# Patient Record
Sex: Female | Born: 1980 | Race: White | Hispanic: No | Marital: Single | Smoking: Current every day smoker
Health system: Southern US, Community
[De-identification: ages and names within clinical notes are randomized; demographics above are authoritative.]

## PROBLEM LIST (undated history)

## (undated) ENCOUNTER — Inpatient Hospital Stay (HOSPITAL_COMMUNITY): Payer: Self-pay

## (undated) DIAGNOSIS — F419 Anxiety disorder, unspecified: Secondary | ICD-10-CM

## (undated) DIAGNOSIS — G4700 Insomnia, unspecified: Secondary | ICD-10-CM

## (undated) DIAGNOSIS — M549 Dorsalgia, unspecified: Secondary | ICD-10-CM

## (undated) DIAGNOSIS — J302 Other seasonal allergic rhinitis: Secondary | ICD-10-CM

## (undated) DIAGNOSIS — J189 Pneumonia, unspecified organism: Secondary | ICD-10-CM

## (undated) DIAGNOSIS — A6009 Herpesviral infection of other urogenital tract: Secondary | ICD-10-CM

## (undated) DIAGNOSIS — K759 Inflammatory liver disease, unspecified: Secondary | ICD-10-CM

## (undated) DIAGNOSIS — E785 Hyperlipidemia, unspecified: Secondary | ICD-10-CM

## (undated) DIAGNOSIS — G8929 Other chronic pain: Secondary | ICD-10-CM

## (undated) HISTORY — DX: Herpesviral infection of other urogenital tract: A60.09

## (undated) HISTORY — PX: WISDOM TOOTH EXTRACTION: SHX21

---
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
## 2016-03-01 ENCOUNTER — Emergency Department (HOSPITAL_COMMUNITY): Admission: EM | Admit: 2016-03-01 | Discharge: 2016-03-01 | Discharge status: Against Medical Advice | Payer: Self-pay

## 2016-03-01 NOTE — ED Notes (Signed)
Pt called multiple times w/o answer.  It is assumed the Pt left.

## 2016-03-01 NOTE — ED Notes (Signed)
Pt did not answer for triage x1 

## 2016-03-01 NOTE — ED Notes (Signed)
Pt did not answer.

## 2016-09-16 ENCOUNTER — Inpatient Hospital Stay (HOSPITAL_COMMUNITY): Payer: Medicaid Other

## 2016-09-16 ENCOUNTER — Inpatient Hospital Stay (HOSPITAL_COMMUNITY)
Admission: AD | Admit: 2016-09-16 | Discharge: 2016-09-16 | Disposition: A | Discharge status: Home / Self Care | Payer: Medicaid Other | Source: Ambulatory Visit | Attending: Obstetrics and Gynecology | Admitting: Obstetrics and Gynecology

## 2016-09-16 ENCOUNTER — Encounter (HOSPITAL_COMMUNITY): Payer: Self-pay

## 2016-09-16 DIAGNOSIS — O3680X0 Pregnancy with inconclusive fetal viability, not applicable or unspecified: Secondary | ICD-10-CM | POA: Insufficient documentation

## 2016-09-16 DIAGNOSIS — O99331 Smoking (tobacco) complicating pregnancy, first trimester: Secondary | ICD-10-CM | POA: Insufficient documentation

## 2016-09-16 DIAGNOSIS — R102 Pelvic and perineal pain: Secondary | ICD-10-CM | POA: Insufficient documentation

## 2016-09-16 DIAGNOSIS — F17200 Nicotine dependence, unspecified, uncomplicated: Secondary | ICD-10-CM | POA: Diagnosis not present

## 2016-09-16 DIAGNOSIS — Z3201 Encounter for pregnancy test, result positive: Secondary | ICD-10-CM | POA: Diagnosis not present

## 2016-09-16 DIAGNOSIS — O26891 Other specified pregnancy related conditions, first trimester: Secondary | ICD-10-CM

## 2016-09-16 DIAGNOSIS — O99321 Drug use complicating pregnancy, first trimester: Secondary | ICD-10-CM | POA: Diagnosis not present

## 2016-09-16 DIAGNOSIS — Z3A01 Less than 8 weeks gestation of pregnancy: Secondary | ICD-10-CM | POA: Insufficient documentation

## 2016-09-16 DIAGNOSIS — R109 Unspecified abdominal pain: Secondary | ICD-10-CM

## 2016-09-16 LAB — URINALYSIS, ROUTINE W REFLEX MICROSCOPIC
Bacteria, UA: NONE SEEN
Bilirubin Urine: NEGATIVE
GLUCOSE, UA: NEGATIVE mg/dL
Hgb urine dipstick: NEGATIVE
KETONES UR: NEGATIVE mg/dL
NITRITE: NEGATIVE
PROTEIN: NEGATIVE mg/dL
Specific Gravity, Urine: 1.023 (ref 1.005–1.030)
pH: 5 (ref 5.0–8.0)

## 2016-09-16 LAB — POCT PREGNANCY, URINE: Preg Test, Ur: POSITIVE — AB

## 2016-09-16 LAB — RAPID URINE DRUG SCREEN, HOSP PERFORMED
AMPHETAMINES: NOT DETECTED
Barbiturates: NOT DETECTED
Benzodiazepines: NOT DETECTED
Cocaine: NOT DETECTED
OPIATES: NOT DETECTED
TETRAHYDROCANNABINOL: NOT DETECTED

## 2016-09-16 LAB — HCG, QUANTITATIVE, PREGNANCY: hCG, Beta Chain, Quant, S: 34979 m[IU]/mL — ABNORMAL HIGH (ref <5)

## 2016-09-16 LAB — WET PREP, GENITAL
Clue Cells Wet Prep HPF POC: NONE SEEN
Sperm: NONE SEEN
TRICH WET PREP: NONE SEEN
Yeast Wet Prep HPF POC: NONE SEEN

## 2016-09-16 NOTE — MAU Note (Signed)
Patient presents with onset of an aching pain in lower abdominal area x 2 days, denies vaginal bleeding, having a lot more discharge with no odor.  Unsure of LMP

## 2016-09-16 NOTE — Discharge Instructions (Signed)

## 2016-09-16 NOTE — MAU Provider Note (Signed)
History   G3P1011 @ 8 wks per LMP of 07/22/16 in with low abd pain that has been groing on for three days now. Denies vag bleeding or discharge. Pt taking 90mg  of methadone a day.  CSN: [id number]  Arrival date & time 09/16/16  [id number]   First Provider Initiated Contact with Patient 09/16/16 [phone]      Chief Complaint  Patient presents with  . Abdominal Pain    HPI  No past medical history on file.  No past surgical history on file.  No family history on file.  Social History  Substance Use Topics  . Smoking status: Current Every Day Smoker  . Smokeless tobacco: Never Used  . Alcohol use Not on file    OB History    Gravida Para Term Preterm AB Living   3 1 1   1 1    SAB TAB Ectopic Multiple Live Births     1     1      Review of Systems  Constitutional: Negative.   HENT: Negative.   Eyes: Negative.   Respiratory: Negative.   Cardiovascular: Negative.   Gastrointestinal: Positive for abdominal pain.  Endocrine: Negative.   Genitourinary: Negative.   Musculoskeletal: Negative.   Skin: Negative.   Allergic/Immunologic: Negative.   Neurological: Negative.   Hematological: Negative.   Psychiatric/Behavioral: Negative.     Allergies  Patient has no allergy information on record.  Home Medications    BP 113/73 (BP Location: Right Arm)   Pulse 88   Temp 98.5 F (36.9 C) (Oral)   Resp 16   Ht 6' (1.829 m)   Wt 144 lb (65.3 kg)   LMP 07/21/2016 (Within Weeks)   BMI 19.53 kg/m   Physical Exam  Constitutional: She is oriented to person, place, and time. She appears well-developed and well-nourished.  HENT:  Head: Normocephalic.  Eyes: Pupils are equal, round, and reactive to light.  Neck: Normal range of motion.  Cardiovascular: Normal rate, regular rhythm, normal heart sounds and intact distal pulses.   Pulmonary/Chest: Effort normal and breath sounds normal.  Abdominal: Soft. Bowel sounds are normal.  Genitourinary: Vagina normal and uterus normal.   Musculoskeletal: Normal range of motion.  Neurological: She is alert and oriented to person, place, and time. She has normal reflexes.  Skin: Skin is warm and dry.  Psychiatric: She has a normal mood and affect. Her behavior is normal. Judgment and thought content normal.    MAU Course  Procedures (including critical care time)  Labs Reviewed  POCT PREGNANCY, URINE - Abnormal; Notable for the following:       Result Value   Preg Test, Ur POSITIVE (*)    All other components within normal limits  URINALYSIS, ROUTINE W REFLEX MICROSCOPIC   No results found.   1. Abdominal pain during pregnancy in first trimester     CLINICAL DATA:  Pelvic pain  EXAM: OBSTETRIC <14 WK US AND TRANSVAGINAL OB US  TECHNIQUE: Both transabdominal and transvaginal ultrasound examinations were performed for complete evaluation of the gestation as well as the maternal uterus, adnexal regions, and pelvic cul-de-sac. Transvaginal technique was performed to assess early pregnancy.  COMPARISON:  None.  FINDINGS: Intrauterine gestational sac: Single, elongated appearance  Yolk sac:  Questionable  Embryo:  Questionable  Cardiac Activity: Not visualized  Heart Rate:   bpm  MSD: 24  mm   7 w   2  d  CRL: Questionable, possibly 4 mm 6 w 1 d [place]  EDC:  Subchorionic hemorrhage:  None visualized.  Maternal uterus/adnexae: No adnexal mass or free fluid.  IMPRESSION: Elongated intrauterine gestational sac with questionable yolk sac or fetal pole. Findings are suspicious but not yet definitive for failed pregnancy. Recommend follow-up US in 10-14 days for definitive diagnosis. This recommendation follows SRU consensus guidelines: Diagnostic Criteria for Nonviable Pregnancy Early in the First Trimester. Malva Limes Engl J Med 2013; [id number]:4540-98; 369:1443-51.   Electronically Signed   By: Charlett NoseKevin  Dover M.D.   On: 09/16/2016 09:02   MDM  VSS, Questionable failed preg. Will repeat us in 10 days. Message  sent to clinic for follow up on pt. Findings discussed with pt and she verbalized understanding. Pt is to f/u for repeat u/s on 09/19/16 and in clinic for results. Message sent to adm pool. Will d/c home in stable condition.

## 2016-09-17 LAB — GC/CHLAMYDIA PROBE AMP (~~LOC~~) NOT AT ARMC
Chlamydia: NEGATIVE
NEISSERIA GONORRHEA: NEGATIVE

## 2016-09-25 ENCOUNTER — Encounter (HOSPITAL_COMMUNITY): Payer: Self-pay | Admitting: *Deleted

## 2016-09-25 ENCOUNTER — Inpatient Hospital Stay (HOSPITAL_COMMUNITY): Payer: Medicaid Other

## 2016-09-25 ENCOUNTER — Inpatient Hospital Stay (HOSPITAL_COMMUNITY)
Admission: AD | Admit: 2016-09-25 | Discharge: 2016-09-25 | Disposition: A | Discharge status: Home / Self Care | Payer: Medicaid Other | Source: Ambulatory Visit | Attending: Family Medicine | Admitting: Family Medicine

## 2016-09-25 DIAGNOSIS — Z3A09 9 weeks gestation of pregnancy: Secondary | ICD-10-CM | POA: Insufficient documentation

## 2016-09-25 DIAGNOSIS — O208 Other hemorrhage in early pregnancy: Secondary | ICD-10-CM

## 2016-09-25 DIAGNOSIS — O021 Missed abortion: Secondary | ICD-10-CM | POA: Insufficient documentation

## 2016-09-25 DIAGNOSIS — O209 Hemorrhage in early pregnancy, unspecified: Secondary | ICD-10-CM | POA: Diagnosis present

## 2016-09-25 DIAGNOSIS — O99331 Smoking (tobacco) complicating pregnancy, first trimester: Secondary | ICD-10-CM | POA: Diagnosis not present

## 2016-09-25 DIAGNOSIS — O26899 Other specified pregnancy related conditions, unspecified trimester: Secondary | ICD-10-CM

## 2016-09-25 DIAGNOSIS — R109 Unspecified abdominal pain: Secondary | ICD-10-CM

## 2016-09-25 LAB — CBC
HCT: 34.9 % — ABNORMAL LOW (ref 36.0–46.0)
Hemoglobin: 12.4 g/dL (ref 12.0–15.0)
MCH: 28.9 pg (ref 26.0–34.0)
MCHC: 35.5 g/dL (ref 30.0–36.0)
MCV: 81.4 fL (ref 78.0–100.0)
PLATELETS: 176 10*3/uL (ref 150–400)
RBC: 4.29 MIL/uL (ref 3.87–5.11)
RDW: 12.8 % (ref 11.5–15.5)
WBC: 5.6 10*3/uL (ref 4.0–10.5)

## 2016-09-25 LAB — HCG, QUANTITATIVE, PREGNANCY: HCG, BETA CHAIN, QUANT, S: 12953 m[IU]/mL — AB (ref <5)

## 2016-09-25 LAB — URINALYSIS, ROUTINE W REFLEX MICROSCOPIC
BILIRUBIN URINE: NEGATIVE
Glucose, UA: NEGATIVE mg/dL
Hgb urine dipstick: NEGATIVE
Ketones, ur: NEGATIVE mg/dL
Leukocytes, UA: NEGATIVE
NITRITE: NEGATIVE
PROTEIN: NEGATIVE mg/dL
SPECIFIC GRAVITY, URINE: 1.019 (ref 1.005–1.030)
pH: 6 (ref 5.0–8.0)

## 2016-09-25 LAB — ABO/RH: ABO/RH(D): O POS

## 2016-09-25 LAB — WET PREP, GENITAL
CLUE CELLS WET PREP: NONE SEEN
Sperm: NONE SEEN
Trich, Wet Prep: NONE SEEN
Yeast Wet Prep HPF POC: NONE SEEN

## 2016-09-25 MED ORDER — MISOPROSTOL 200 MCG PO TABS: 800.0000 ug | ORAL_TABLET | Freq: Once | ORAL | 0 refills | Status: DC

## 2016-09-25 NOTE — Discharge Instructions (Signed)
US date on August 17th at 9am  Vaginal Bleeding During Pregnancy, First Trimester A small amount of bleeding (spotting) from the vagina is common in early pregnancy. Sometimes the bleeding is normal and is not a problem, and sometimes it is a sign of something serious. Be sure to tell your doctor about any bleeding from your vagina right away. Follow these instructions at home:  Watch your condition for any changes.  Follow your doctor's instructions about how active you can be.  If you are on bed rest: ? You may need to stay in bed and only get up to use the bathroom. ? You may be allowed to do some activities. ? If you need help, make plans for someone to help you.  Write down: ? The number of pads you use each day. ? How often you change pads. ? How soaked (saturated) your pads are.  Do not use tampons.  Do not douche.  Do not have sex or orgasms until your doctor says it is okay.  If you pass any tissue from your vagina, save the tissue so you can show it to your doctor.  Only take medicines as told by your doctor.  Do not take aspirin because it can make you bleed.  Keep all follow-up visits as told by your doctor. Contact a doctor if:  You bleed from your vagina.  You have cramps.  You have labor pains.  You have a fever that does not go away after you take medicine. Get help right away if:  You have very bad cramps in your back or belly (abdomen).  You pass large clots or tissue from your vagina.  You bleed more.  You feel light-headed or weak.  You pass out (faint).  You have chills.  You are leaking fluid or have a gush of fluid from your vagina.  You pass out while pooping (having a bowel movement). This information is not intended to replace advice given to you by your health care provider. Make sure you discuss any questions you have with your health care provider. Document Released: 06/22/2013 Document Revised: 07/14/2015 Document Reviewed:  10/13/2012 Elsevier Interactive Patient Education  [employer]2018 Elsevier Inc.

## 2016-09-25 NOTE — MAU Provider Note (Signed)
Chief Complaint: Abdominal Pain (cramps) and Vaginal Bleeding (spotting  since   1230 pm)   SUBJECTIVE HPI: Becky Ray is a 36 y.o. G3P1011 at 748w3d who presents to MAU for vaginal bleeding with associated abdominal cramps. Patient states that symptoms started today. She denies any fevers. Vaginal bleeding is minimal. She denies any vaginal discharge. Has not started prenatal care yet.   No past medical history on file. OB History  Gravida Para Term Preterm AB Living  3 1 1   1 1   SAB TAB Ectopic Multiple Live Births    1     1    # Outcome Date GA Lbr Len/2nd Weight Sex Delivery Anes PTL Lv  3 Current           2 TAB           1 Term              No past surgical history on file. Social History   Social History  . Marital status: Single    Spouse name: N/A  . Number of children: N/A  . Years of education: N/A   Occupational History  . Not on file.   Social History Main Topics  . Smoking status: Current Every Day Smoker  . Smokeless tobacco: Never Used  . Alcohol use No  . Drug use: Unknown     Comment: Methadone  . Sexual activity: Yes    Birth control/ protection: Pill   Other Topics Concern  . Not on file   Social History Narrative  . No narrative on file   No current facility-administered medications on file prior to encounter.    No current outpatient prescriptions on file prior to encounter.   No Known Allergies  I have reviewed the past Medical Hx, Surgical Hx, Social Hx, Allergies and Medications.   REVIEW OF SYSTEMS All systems reviewed and are negative for acute change except as noted in the HPI.   OBJECTIVE BP 107/66   Pulse 61   Temp 97.8 F (36.6 C) (Oral)   Resp 16   Wt 144 lb 1.9 oz (65.4 kg)   LMP 07/21/2016 (Within Weeks)   SpO2 98%   BMI 19.55 kg/m   PHYSICAL EXAM Constitutional: Well-developed, well-nourished female in no acute distress.  Cardiovascular: normal rate and regular rhythm Respiratory: normal rate and effort.  GI:  Abd soft, non-tender, non-distended. No guarding or rebound. MS: Extremities nontender, no edema, normal ROM Neurologic: Alert and oriented x 4.  GU: Neg CVAT. SPECULUM EXAM: NEFG, physiologic discharge, + brown blood noted, cervix clean BIMANUAL: cervix 1cm dilated; uterus feels enlarged, no adnexal tenderness or masses. No CMT.  LAB RESULTS Results for orders placed or performed during the hospital encounter of 09/25/16 (from the past 24 hour(s))  Urinalysis, Routine w reflex microscopic     Status: Abnormal   Collection Time: 09/25/16  1:10 PM  Result Value Ref Range   Color, Urine YELLOW YELLOW   APPearance HAZY (A) CLEAR   Specific Gravity, Urine 1.019 1.005 - 1.030   pH 6.0 5.0 - 8.0   Glucose, UA NEGATIVE NEGATIVE mg/dL   Hgb urine dipstick NEGATIVE NEGATIVE   Bilirubin Urine NEGATIVE NEGATIVE   Ketones, ur NEGATIVE NEGATIVE mg/dL   Protein, ur NEGATIVE NEGATIVE mg/dL   Nitrite NEGATIVE NEGATIVE   Leukocytes, UA NEGATIVE NEGATIVE  Wet prep, genital     Status: Abnormal   Collection Time: 09/25/16  1:50 PM  Result Value Ref Range  Yeast Wet Prep HPF POC NONE SEEN NONE SEEN   Trich, Wet Prep NONE SEEN NONE SEEN   Clue Cells Wet Prep HPF POC NONE SEEN NONE SEEN   WBC, Wet Prep HPF POC FEW (A) NONE SEEN   Sperm NONE SEEN   CBC     Status: Abnormal   Collection Time: 09/25/16  2:02 PM  Result Value Ref Range   WBC 5.6 4.0 - 10.5 K/uL   RBC 4.29 3.87 - 5.11 MIL/uL   Hemoglobin 12.4 12.0 - 15.0 g/dL   HCT 16.1 (L) 09.6 - 04.5 %   MCV 81.4 78.0 - 100.0 fL   MCH 28.9 26.0 - 34.0 pg   MCHC 35.5 30.0 - 36.0 g/dL   RDW 40.9 81.1 - 91.4 %   Platelets 176 150 - 400 K/uL  ABO/Rh     Status: None   Collection Time: 09/25/16  2:02 PM  Result Value Ref Range   ABO/RH(D) O POS   hCG, quantitative, pregnancy     Status: Abnormal   Collection Time: 09/25/16  2:02 PM  Result Value Ref Range   hCG, Beta Chain, Quant, S 12,953 (H) <5 mIU/mL    IMAGING US Ob Comp Less 14  Wks  Result Date: 09/16/2016 CLINICAL DATA:  Pelvic pain EXAM: OBSTETRIC <14 WK [place] AND TRANSVAGINAL OB US TECHNIQUE: Both transabdominal and transvaginal ultrasound examinations were performed for complete evaluation of the gestation as well as the maternal uterus, adnexal regions, and pelvic cul-de-sac. Transvaginal technique was performed to assess early pregnancy. COMPARISON:  None. FINDINGS: Intrauterine gestational sac: Single, elongated appearance Yolk sac:  Questionable Embryo:  Questionable Cardiac Activity: Not visualized Heart Rate:   bpm MSD: 24  mm   7 w   2  d CRL: Questionable, possibly 4 mm 6 w 1 d [place] EDC: Subchorionic hemorrhage:  None visualized. Maternal uterus/adnexae: No adnexal mass or free fluid. IMPRESSION: Elongated intrauterine gestational sac with questionable yolk sac or fetal pole. Findings are suspicious but not yet definitive for failed pregnancy. Recommend follow-up US in 10-14 days for definitive diagnosis. This recommendation follows SRU consensus guidelines: Diagnostic Criteria for Nonviable Pregnancy Early in the First Trimester. Malva Limes Med 2013; 782:9562-13. Electronically Signed   By: Charlett Nose M.D.   On: 09/16/2016 09:02   US Ob Transvaginal  Result Date: 09/25/2016 CLINICAL DATA:  Pregnant patient with vaginal bleeding. EXAM: TRANSVAGINAL OB ULTRASOUND TECHNIQUE: Transvaginal ultrasound was performed for complete evaluation of the gestation as well as the maternal uterus, adnexal regions, and pelvic cul-de-sac. COMPARISON:  Pelvic ultrasound 09/16/2016 FINDINGS: Intrauterine gestational sac: Single Yolk sac:  Not Visualized. Embryo:  Visualized. Cardiac Activity: Not Visualized. Heart Rate: 0 bpm CRL:   3.5  mm   6 w 0 d                  [place] EDC: 05/21/2017 Subchorionic hemorrhage:  None visualized. Maternal uterus/adnexae: Simple cyst within the right ovary. Normal appearance of left ovary. IMPRESSION: Suggestion of fetal pole with crown-rump length of 3.5 mm. No  heartbeat is identified. Findings are suspicious but not yet definitive for failed pregnancy. Recommend follow-up US in 10-14 days for definitive diagnosis. This recommendation follows SRU consensus guidelines: Diagnostic Criteria for Nonviable Pregnancy Early in the First Trimester. Malva Limes Med 2013; [id number]:5784-69. Electronically Signed   By: Annia Belt M.D.   On: 09/25/2016 15:30   US Ob Transvaginal  Result Date: 09/16/2016 CLINICAL DATA:  Pelvic pain EXAM:  OBSTETRIC <14 WK [place] AND TRANSVAGINAL OB US TECHNIQUE: Both transabdominal and transvaginal ultrasound examinations were performed for complete evaluation of the gestation as well as the maternal uterus, adnexal regions, and pelvic cul-de-sac. Transvaginal technique was performed to assess early pregnancy. COMPARISON:  None. FINDINGS: Intrauterine gestational sac: Single, elongated appearance Yolk sac:  Questionable Embryo:  Questionable Cardiac Activity: Not visualized Heart Rate:   bpm MSD: 24  mm   7 w   2  d CRL: Questionable, possibly 4 mm 6 w 1 d [place] EDC: Subchorionic hemorrhage:  None visualized. Maternal uterus/adnexae: No adnexal mass or free fluid. IMPRESSION: Elongated intrauterine gestational sac with questionable yolk sac or fetal pole. Findings are suspicious but not yet definitive for failed pregnancy. Recommend follow-up US in 10-14 days for definitive diagnosis. This recommendation follows SRU consensus guidelines: Diagnostic Criteria for Nonviable Pregnancy Early in the First Trimester. Malva Limes Med 2013; 409:8119-14. Electronically Signed   By: Charlett Nose M.D.   On: 09/16/2016 09:02    MAU COURSE [place] ordered without changes from last [place] 9 days ago; no FHT bHCG decreased by more than half Discussed concern for failed pregnancy with patient and treatment options - patient decided on medical treatment  MDM Plan of care reviewed with patient, including labs and tests ordered and medical treatment.  Early Intrauterine Pregnancy  Failure  _X_  Documented intrauterine pregnancy failure less than or equal to [redacted] weeks gestation _X_  No serious current illness _X_  Baseline Hgb greater than or equal to 10g/dl _X_  Patient has easily accessible transportation to the hospital _X_  Clear preference _X_  Practitioner/physician deems patient reliable _X_  Counseling by practitioner or physician ___  Patient education by RN ___  Consent form signed ___  Rho-Gam given by RN if indicated _X_ Medication dispensed  _X__   Cytotec 800 mcg  __   Intravaginally by patient at home         __   Intravaginally by RN in MAU        __   Rectally by patient at home        __   Rectally by RN in MAU  ___  Ibuprofen 600 mg 1 tablet by mouth every 6 hours as needed #30 ___  Hydrocodone/acetaminophen 5/325 mg by mouth every 4 to 6 hours as needed ___  Phenergan 12.5 mg by mouth every 4 hours as needed for nausea   ASSESSMENT 1. Vaginal bleeding affecting early pregnancy   2. Abdominal cramping affecting pregnancy   3. Missed abortion     PLAN -Treat with Cytotec per patient wishes; counseled on side effects and return precatuions -Discharge home in stable condition. -Follow-up visit in 2 weeks with [facility] -Handout given   Caryl Ada, DO OB [facility], [facility] 09/25/2016, 1:38 PM

## 2016-09-25 NOTE — MAU Note (Signed)
+  lower abdominal cramping Intermittent Rating pain 7/10 Has not taken any medication  Started a couple of hours ago  +vaginal bleeding Spotting in nature Brown at first then pink

## 2016-09-26 ENCOUNTER — Ambulatory Visit (HOSPITAL_COMMUNITY): Payer: Self-pay

## 2016-09-26 LAB — GC/CHLAMYDIA PROBE AMP (~~LOC~~) NOT AT ARMC
Chlamydia: NEGATIVE
Neisseria Gonorrhea: NEGATIVE

## 2016-10-05 ENCOUNTER — Ambulatory Visit (HOSPITAL_COMMUNITY): Payer: Self-pay

## 2016-10-05 ENCOUNTER — Telehealth: Payer: Self-pay | Admitting: Lab

## 2016-10-05 NOTE — Telephone Encounter (Signed)
Enrique Sack [phone]) from [facility] Pre service called wanting a Prior Authorization for Amelinda Sieja August 05, 1980 mrn [id number].

## 2016-10-08 ENCOUNTER — Telehealth: Payer: Self-pay | Admitting: Lab

## 2016-10-08 NOTE — Telephone Encounter (Addendum)
Per chart review was last seen on 09/25/16 in [facility] and was diagnosed as failed pregnancy, given cytotec and scheduled a follow up in our office 10/17/16. Discussed with Dr. Alysia Penna and he also reviewed her chart and agreed with cancelling [place] for 10/11/16 unless she is having any problems, but needs to keep follow up for 10/17/16.  I called Becky Ray and left a message we have her scheduled for follow up in our office 10/17/16 , are cancelling [place] scheduled for 10/11/16, but if you are having any problems- please call our office and ask to speak with clinical staff. I called scheduling office to cancel [place] and notifed precert center [facility] as well.

## 2016-10-08 NOTE — Telephone Encounter (Signed)
Enrique Sack from [facility] Pre service called wanting a Prior authorization for Becky Ray 08-24-1980 mrn [id number]

## 2016-10-11 ENCOUNTER — Ambulatory Visit (HOSPITAL_COMMUNITY): Payer: Medicaid Other

## 2016-10-17 ENCOUNTER — Telehealth: Payer: Self-pay | Admitting: *Deleted

## 2016-10-17 ENCOUNTER — Encounter: Payer: Medicaid Other | Admitting: Advanced Practice Midwife

## 2016-10-17 ENCOUNTER — Encounter: Payer: Self-pay | Admitting: Advanced Practice Midwife

## 2016-10-17 NOTE — Telephone Encounter (Signed)
Pt missed appointment today with Misty StanleyLisa. Called patient to see if she would like to reschedule. She states that she is feeling well and does not care for an appointment at this time.

## 2017-06-08 ENCOUNTER — Encounter (HOSPITAL_COMMUNITY): Payer: Self-pay

## 2017-06-08 ENCOUNTER — Emergency Department (HOSPITAL_COMMUNITY)
Admission: EM | Admit: 2017-06-08 | Discharge: 2017-06-09 | Disposition: A | Discharge status: Home / Self Care | Payer: Medicaid Other | Attending: Emergency Medicine | Admitting: Emergency Medicine

## 2017-06-08 DIAGNOSIS — F1010 Alcohol abuse, uncomplicated: Secondary | ICD-10-CM | POA: Insufficient documentation

## 2017-06-08 DIAGNOSIS — Z79899 Other long term (current) drug therapy: Secondary | ICD-10-CM | POA: Diagnosis not present

## 2017-06-08 DIAGNOSIS — F1920 Other psychoactive substance dependence, uncomplicated: Secondary | ICD-10-CM

## 2017-06-08 DIAGNOSIS — F17200 Nicotine dependence, unspecified, uncomplicated: Secondary | ICD-10-CM | POA: Diagnosis not present

## 2017-06-08 DIAGNOSIS — F1910 Other psychoactive substance abuse, uncomplicated: Secondary | ICD-10-CM | POA: Diagnosis not present

## 2017-06-08 LAB — COMPREHENSIVE METABOLIC PANEL
ALBUMIN: 3.8 g/dL (ref 3.5–5.0)
ALT: 30 U/L (ref 14–54)
ANION GAP: 10 (ref 5–15)
AST: 30 U/L (ref 15–41)
Alkaline Phosphatase: 66 U/L (ref 38–126)
BILIRUBIN TOTAL: 0.8 mg/dL (ref 0.3–1.2)
BUN: 14 mg/dL (ref 6–20)
CHLORIDE: 104 mmol/L (ref 101–111)
CO2: 23 mmol/L (ref 22–32)
Calcium: 8.9 mg/dL (ref 8.9–10.3)
Creatinine, Ser: 0.74 mg/dL (ref 0.44–1.00)
GFR calc Af Amer: >60 mL/min (ref >60)
Glucose, Bld: 127 mg/dL — ABNORMAL HIGH (ref 65–99)
Potassium: 3.2 mmol/L — ABNORMAL LOW (ref 3.5–5.1)
Sodium: 137 mmol/L (ref 135–145)
TOTAL PROTEIN: 7.2 g/dL (ref 6.5–8.1)

## 2017-06-08 LAB — CBC
HEMATOCRIT: 37.8 % (ref 36.0–46.0)
HEMOGLOBIN: 13 g/dL (ref 12.0–15.0)
MCH: 28.3 pg (ref 26.0–34.0)
MCHC: 34.4 g/dL (ref 30.0–36.0)
MCV: 82.4 fL (ref 78.0–100.0)
Platelets: 201 10*3/uL (ref 150–400)
RBC: 4.59 MIL/uL (ref 3.87–5.11)
RDW: 13.7 % (ref 11.5–15.5)
WBC: 3.9 10*3/uL — AB (ref 4.0–10.5)

## 2017-06-08 LAB — I-STAT BETA HCG BLOOD, ED (MC, WL, AP ONLY): I-stat hCG, quantitative: <5 m[IU]/mL (ref <5)

## 2017-06-08 LAB — RAPID URINE DRUG SCREEN, HOSP PERFORMED
Amphetamines: NOT DETECTED
BARBITURATES: NOT DETECTED
BENZODIAZEPINES: NOT DETECTED
Cocaine: NOT DETECTED
Opiates: POSITIVE — AB
TETRAHYDROCANNABINOL: POSITIVE — AB

## 2017-06-08 LAB — ETHANOL

## 2017-06-08 NOTE — ED Triage Notes (Signed)
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 Last used heroin this AM, alcohol yesterday and xanax two days ago. Normally drinks a fifth a day. Pt states she feels anxious, denies SI/HI/AVH

## 2017-06-09 MED ORDER — ONDANSETRON 4 MG PO TBDP: 4.0000 mg | ORAL_TABLET | Freq: Three times a day (TID) | ORAL | 0 refills | Status: DC | PRN

## 2017-06-09 MED ORDER — POTASSIUM CHLORIDE CRYS ER 20 MEQ PO TBCR
40.0000 meq | EXTENDED_RELEASE_TABLET | Freq: Once | ORAL | Status: AC
  Administered 2017-06-09: 40 meq via ORAL
  Filled 2017-06-09: qty 2

## 2017-06-09 MED ORDER — LORAZEPAM 1 MG PO TABS
1.0000 mg | ORAL_TABLET | Freq: Once | ORAL | Status: AC
  Administered 2017-06-09: 1 mg via ORAL
  Filled 2017-06-09: qty 1

## 2017-06-09 NOTE — ED Provider Notes (Signed)
[facility] EMERGENCY DEPARTMENT Provider Note   CSN: [id number] Arrival date & time: 06/08/17  2016     History   Chief Complaint Chief Complaint  Patient presents with  . Detox    HPI Becky Ray is a 38 y.o. female.  HPI 37 year old Caucasian female past medical history significant for polysubstance abuse including heroin, alcohol and Xanax presents to the emergency department today requesting detox.  Patient states that she has been a polysubstance abuser for several years.  She has been in rehab before.  Patient states that she last used heroin this morning.  She reports injecting to her antecubital fossa.  Patient also reports using alcohol yesterday and Xanax 2 days ago.  She normally drinks 1/5 a day of alcohol.  Patient states that she feels anxious and some shaking but denies any other associated symptoms.  Patient denies any SI, HI or auditory visual hallucinations.  Ports daily tobacco use.Denies chest pain or shortness of breath.    History reviewed. No pertinent past medical history.  There are no active problems to display for this patient.   History reviewed. No pertinent surgical history.   OB History    Gravida  3   Para  1   Term  1   Preterm      AB  1   Living  1     SAB      TAB  1   Ectopic      Multiple      Live Births  1            Home Medications    Prior to Admission medications   Medication Sig Start Date End Date Taking? Authorizing Provider  diphenhydrAMINE (BENADRYL) 25 MG tablet Take 25 mg by mouth at bedtime as needed for sleep.    [provider]  methadone (DOLOPHINE) 1 mg/ml oral solution Take 100 mg/kg by mouth daily.    [provider]  misoprostol (CYTOTEC) 200 MCG tablet Take 4 tablets (800 mcg total) by mouth once. Place buccally 09/25/16 09/25/16  Pincus Large, DO  ondansetron (ZOFRAN ODT) 4 MG disintegrating tablet Take 1 tablet (4 mg total) by mouth every 8 (eight) hours  as needed for nausea or vomiting. 06/09/17   Rise Mu, PA-C  Prenatal Vit-Fe Fumarate-FA (PRENATAL MULTIVITAMIN) TABS tablet Take 1 tablet by mouth daily.    [provider]    Family History No family history on file.  Social History Social History   Tobacco Use  . Smoking status: Current Every Day Smoker  . Smokeless tobacco: Never Used  Substance Use Topics  . Alcohol use: Yes  . Drug use: Yes    Types: IV    Comment: Methadone     Allergies   Patient has no known allergies.   Review of Systems Review of Systems  All other systems reviewed and are negative.    Physical Exam Updated Vital Signs BP 125/76 (BP Location: Right Arm)   Pulse 90   Temp 97.8 F (36.6 C) (Oral)   Resp 16   Ht 6' (1.829 m)   Wt 61.2 kg (135 lb)   LMP 05/08/2017   SpO2 100%   Breastfeeding? Unknown   BMI 18.31 kg/m    Physical Exam  Constitutional: She is oriented to person, place, and time. She appears well-developed and well-nourished. No distress.  HENT:  Head: Normocephalic and atraumatic.  Eyes: Conjunctivae are normal. Right eye exhibits no discharge.  Left eye exhibits no discharge. No scleral icterus.  Neck: Normal range of motion.  Cardiovascular: Normal rate, regular rhythm, normal heart sounds and intact distal pulses. Exam reveals no gallop and no friction rub.  No murmur heard. 2+ pulses in all extremities.  Pulmonary/Chest: Effort normal and breath sounds normal. No stridor. No respiratory distress. She has no wheezes. She has no rales. She exhibits no tenderness.  Abdominal: Soft. Bowel sounds are normal. There is no tenderness. There is no rebound.  Musculoskeletal: Normal range of motion.  Neurological: She is alert and oriented to person, place, and time.  Follows commands appropriate.  Alert and oriented x3.  Patient has mild tremors.  Skin: Skin is warm and dry. Capillary refill takes less than 2 seconds. No pallor.  Track marks noted in the  antecubital fossa without any signs of overlying cellulitis.  Psychiatric: Her behavior is normal. Judgment and thought content normal.  Nursing note and vitals reviewed.    ED Treatments / Results  Labs (all labs ordered are listed, but only abnormal results are displayed) Labs Reviewed  COMPREHENSIVE METABOLIC PANEL - Abnormal; Notable for the following components:      Result Value   Potassium 3.2 (*)    Glucose, Bld 127 (*)    All other components within normal limits  CBC - Abnormal; Notable for the following components:   WBC 3.9 (*)    All other components within normal limits  RAPID URINE DRUG SCREEN, HOSP PERFORMED - Abnormal; Notable for the following components:   Opiates POSITIVE (*)    Tetrahydrocannabinol POSITIVE (*)    All other components within normal limits  ETHANOL  I-STAT BETA HCG BLOOD, ED ([facility], [facility], [facility] ONLY)    EKG None  Radiology No results found.  Procedures Procedures (including critical care time)  Medications Ordered in ED Medications  potassium chloride SA (K-DUR,KLOR-CON) CR tablet 40 mEq (40 mEq Oral Given 06/09/17 0059)  LORazepam (ATIVAN) tablet 1 mg (1 mg Oral Given 06/09/17 0059)     Initial Impression / Assessment and Plan / ED Course  I have reviewed the triage vital signs and the nursing notes.  Pertinent labs & imaging results that were available during my care of the patient were reviewed by me and considered in my medical decision making (see chart for details).     Patient presents the emergency department today for evaluation of wanting detox from polysubstance abuse.  Patient reports some anxiety.  Denies any other associated symptoms.  Patient denies any SI, HI, auditory visual hallucinations.  On exam patient is not tachycardic or hypotensive.  She is afebrile.  Heart regular rate and rhythm without any rubs murmurs gallops.  Lungs clear to auscultation bilaterally.  No focal abdominal tenderness and normal bowel sounds.   No signs of cellulitis and site of injections.  Neurovascular intact in all extremities.  Patient has mild tremors but otherwise has no focal neuro deficit.  Patient alert oriented x3.  Lab work initiated by triage reveals no leukocytosis.  Mild hypokalemia 3.2 which was replaced with oral potassium.  Otherwise lab work is reassuring.  UDS positive for opiates and marijuana.  Patient will be given 1 mg of p.o. Ativan.  I also given her p.o. potassium in the ED.  Patient be discharged with Zofran, Tylenol and Motrin.  I discussed with patient that we do not perform detox in the ER.  Patient has no acute signs of withdrawal including seizure-like activity or mental status change.  I gave patient resources for [facility] and have provided peer support consult.  Patient given resources in hand and provided phone numbers.  Pt is hemodynamically stable, in NAD, & able to ambulate in the ED. Evaluation does not show pathology that would require ongoing emergent intervention or inpatient treatment. I explained the diagnosis to the patient. Pain has been managed & has no complaints prior to dc. Pt is comfortable with above plan and is stable for discharge at this time. All questions were answered prior to disposition. Strict return precautions for f/u to the ED were discussed. Encouraged follow up with PCP.   Final Clinical Impressions(s) / ED Diagnoses   Final diagnoses:  Polysubstance dependence (HCC)  Alcohol abuse    ED Discharge Orders        Ordered    ondansetron (ZOFRAN ODT) 4 MG disintegrating tablet  Every 8 hours PRN     06/09/17 0058      Rise MuLeaphart, Kenneth T, PA-C 06/09/17 0117  Derwood KaplanNanavati, Ankit, MD 06/11/17 [phone]

## 2017-06-09 NOTE — ED Notes (Signed)
Pt reports she was leaving and going to [facility] so that she can get inpatient help.  Signature pad in room not working.  Pt verbalized understanding of discharge instructions including referrals for help and Crisis line numbers.

## 2017-06-09 NOTE — Discharge Instructions (Addendum)
Calcium was slightly low.  Make sure you increase her potassium intake including bananas or leafy greens.  Have given you Zofran to help with any nausea you may have.  May take Motrin and Tylenol for any body aches.  Please follow-up with the resources that I have given you.  Return the ED with any worsening symptoms.

## 2018-04-23 DIAGNOSIS — F1911 Other psychoactive substance abuse, in remission: Secondary | ICD-10-CM | POA: Insufficient documentation

## 2018-05-03 ENCOUNTER — Emergency Department (HOSPITAL_COMMUNITY): Payer: BLUE CROSS/BLUE SHIELD

## 2018-05-03 ENCOUNTER — Emergency Department (HOSPITAL_COMMUNITY)
Admission: EM | Admit: 2018-05-03 | Discharge: 2018-05-04 | Disposition: A | Discharge status: Home / Self Care | Payer: BLUE CROSS/BLUE SHIELD | Attending: Emergency Medicine | Admitting: Emergency Medicine

## 2018-05-03 ENCOUNTER — Encounter (HOSPITAL_COMMUNITY): Payer: Self-pay | Admitting: Emergency Medicine

## 2018-05-03 DIAGNOSIS — Y92410 Unspecified street and highway as the place of occurrence of the external cause: Secondary | ICD-10-CM | POA: Insufficient documentation

## 2018-05-03 DIAGNOSIS — Y939 Activity, unspecified: Secondary | ICD-10-CM | POA: Diagnosis not present

## 2018-05-03 DIAGNOSIS — Y998 Other external cause status: Secondary | ICD-10-CM | POA: Diagnosis not present

## 2018-05-03 DIAGNOSIS — Z79899 Other long term (current) drug therapy: Secondary | ICD-10-CM | POA: Diagnosis not present

## 2018-05-03 DIAGNOSIS — S62317A Displaced fracture of base of fifth metacarpal bone. left hand, initial encounter for closed fracture: Secondary | ICD-10-CM

## 2018-05-03 DIAGNOSIS — F17200 Nicotine dependence, unspecified, uncomplicated: Secondary | ICD-10-CM | POA: Diagnosis not present

## 2018-05-03 DIAGNOSIS — S6992XA Unspecified injury of left wrist, hand and finger(s), initial encounter: Secondary | ICD-10-CM | POA: Diagnosis present

## 2018-05-03 MED ORDER — KETOROLAC TROMETHAMINE 60 MG/2ML IM SOLN
60.0000 mg | Freq: Once | INTRAMUSCULAR | Status: AC
  Administered 2018-05-04: 60 mg via INTRAMUSCULAR
  Filled 2018-05-03: qty 2
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
## 2018-05-04 LAB — POC URINE PREG, ED: Preg Test, Ur: NEGATIVE

## 2018-05-04 MED ORDER — OXYCODONE-ACETAMINOPHEN 5-325 MG PO TABS: 1.0000 | ORAL_TABLET | Freq: Three times a day (TID) | ORAL | 0 refills | Status: DC | PRN

## 2018-05-04 NOTE — ED Notes (Signed)
Patient verbalizes understanding of discharge instructions. Opportunity for questioning and answers were provided. Armband removed by staff, pt discharged from ED in wheelchair.  

## 2018-05-06 ENCOUNTER — Encounter (HOSPITAL_COMMUNITY): Payer: Self-pay | Admitting: *Deleted

## 2018-05-06 ENCOUNTER — Other Ambulatory Visit: Payer: Self-pay
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
## 2018-05-07 ENCOUNTER — Ambulatory Visit (HOSPITAL_BASED_OUTPATIENT_CLINIC_OR_DEPARTMENT_OTHER): Payer: BLUE CROSS/BLUE SHIELD | Admitting: Anesthesiology

## 2018-05-07 ENCOUNTER — Other Ambulatory Visit: Payer: Self-pay

## 2018-05-07 ENCOUNTER — Encounter (HOSPITAL_BASED_OUTPATIENT_CLINIC_OR_DEPARTMENT_OTHER): Payer: Self-pay

## 2018-05-07 ENCOUNTER — Ambulatory Visit (HOSPITAL_BASED_OUTPATIENT_CLINIC_OR_DEPARTMENT_OTHER)
Admission: RE | Admit: 2018-05-07 | Discharge: 2018-05-07 | Disposition: A | Discharge status: Home / Self Care | Payer: BLUE CROSS/BLUE SHIELD | Attending: Orthopedic Surgery | Admitting: Orthopedic Surgery

## 2018-05-07 ENCOUNTER — Encounter (HOSPITAL_BASED_OUTPATIENT_CLINIC_OR_DEPARTMENT_OTHER)
Admission: RE | Disposition: A | Discharge status: Home / Self Care | Payer: Self-pay | Source: Home / Self Care | Attending: Orthopedic Surgery

## 2018-05-07 DIAGNOSIS — S62327A Displaced fracture of shaft of fifth metacarpal bone, left hand, initial encounter for closed fracture: Secondary | ICD-10-CM | POA: Insufficient documentation

## 2018-05-07 DIAGNOSIS — F17200 Nicotine dependence, unspecified, uncomplicated: Secondary | ICD-10-CM | POA: Diagnosis not present

## 2018-05-07 HISTORY — DX: Pneumonia, unspecified organism: J18.9

## 2018-05-07 HISTORY — DX: Other seasonal allergic rhinitis: J30.2

## 2018-05-07 HISTORY — PX: OPEN REDUCTION INTERNAL FIXATION (ORIF) HAND: SHX5991

## 2018-05-07 HISTORY — DX: Insomnia, unspecified: G47.00

## 2018-05-07 HISTORY — DX: Hyperlipidemia, unspecified: E78.5

## 2018-05-07 HISTORY — DX: Inflammatory liver disease, unspecified: K75.9

## 2018-05-07 HISTORY — DX: Other chronic pain: G89.29

## 2018-05-07 HISTORY — DX: Dorsalgia, unspecified: M54.9

## 2018-05-07 HISTORY — DX: Anxiety disorder, unspecified: F41.9

## 2018-05-07 LAB — POCT PREGNANCY, URINE: Preg Test, Ur: NEGATIVE

## 2018-05-07 SURGERY — OPEN REDUCTION INTERNAL FIXATION (ORIF) HAND
Anesthesia: General | Site: Hand | Laterality: Left

## 2018-05-07 MED ORDER — DEXMEDETOMIDINE HCL IN NACL 200 MCG/50ML IV SOLN
INTRAVENOUS | Status: DC | PRN
  Administered 2018-05-07 (×2): 8 ug via INTRAVENOUS
  Administered 2018-05-07: 4 ug via INTRAVENOUS

## 2018-05-07 MED ORDER — CEFAZOLIN SODIUM-DEXTROSE 2-4 GM/100ML-% IV SOLN: 2.0000 g | INTRAVENOUS | Status: DC

## 2018-05-07 MED ORDER — FENTANYL CITRATE (PF) 100 MCG/2ML IJ SOLN: 50.0000 ug | INTRAMUSCULAR | Status: DC | PRN

## 2018-05-07 MED ORDER — BUPIVACAINE HCL (PF) 0.25 % IJ SOLN
INTRAMUSCULAR | Status: AC
  Filled 2018-05-07: qty 60

## 2018-05-07 MED ORDER — CHLORHEXIDINE GLUCONATE 4 % EX LIQD: 60.0000 mL | Freq: Once | CUTANEOUS | Status: DC

## 2018-05-07 MED ORDER — MIDAZOLAM HCL 5 MG/5ML IJ SOLN
INTRAMUSCULAR | Status: DC | PRN
  Administered 2018-05-07: 2 mg via INTRAVENOUS

## 2018-05-07 MED ORDER — SCOPOLAMINE 1 MG/3DAYS TD PT72: 1.0000 | MEDICATED_PATCH | Freq: Once | TRANSDERMAL | Status: DC | PRN

## 2018-05-07 MED ORDER — MIDAZOLAM HCL 2 MG/2ML IJ SOLN
INTRAMUSCULAR | Status: AC
  Filled 2018-05-07: qty 2

## 2018-05-07 MED ORDER — CEFAZOLIN SODIUM-DEXTROSE 2-4 GM/100ML-% IV SOLN
INTRAVENOUS | Status: AC
  Filled 2018-05-07: qty 100

## 2018-05-07 MED ORDER — SUCCINYLCHOLINE CHLORIDE 20 MG/ML IJ SOLN
INTRAMUSCULAR | Status: DC | PRN
  Administered 2018-05-07: 120 mg via INTRAVENOUS

## 2018-05-07 MED ORDER — PROMETHAZINE HCL 25 MG/ML IJ SOLN: 6.2500 mg | INTRAMUSCULAR | Status: DC | PRN

## 2018-05-07 MED ORDER — FENTANYL CITRATE (PF) 100 MCG/2ML IJ SOLN
INTRAMUSCULAR | Status: AC
  Filled 2018-05-07: qty 2

## 2018-05-07 MED ORDER — BUPIVACAINE HCL (PF) 0.25 % IJ SOLN
INTRAMUSCULAR | Status: DC | PRN
  Administered 2018-05-07: 10 mL

## 2018-05-07 MED ORDER — LIDOCAINE HCL (CARDIAC) PF 100 MG/5ML IV SOSY
PREFILLED_SYRINGE | INTRAVENOUS | Status: DC | PRN
  Administered 2018-05-07: 60 mg via INTRATRACHEAL

## 2018-05-07 MED ORDER — ONDANSETRON HCL 4 MG/2ML IJ SOLN
INTRAMUSCULAR | Status: DC | PRN
  Administered 2018-05-07: 4 mg via INTRAVENOUS

## 2018-05-07 MED ORDER — MIDAZOLAM HCL 2 MG/2ML IJ SOLN: 1.0000 mg | INTRAMUSCULAR | Status: DC | PRN

## 2018-05-07 MED ORDER — ACETAMINOPHEN 500 MG PO TABS: 1000.0000 mg | ORAL_TABLET | Freq: Once | ORAL | Status: DC

## 2018-05-07 MED ORDER — FENTANYL CITRATE (PF) 100 MCG/2ML IJ SOLN: 25.0000 ug | INTRAMUSCULAR | Status: DC | PRN

## 2018-05-07 MED ORDER — LACTATED RINGERS IV SOLN
INTRAVENOUS | Status: DC
  Administered 2018-05-07: 13:00:00 via INTRAVENOUS

## 2018-05-07 MED ORDER — CEFAZOLIN SODIUM-DEXTROSE 2-3 GM-%(50ML) IV SOLR
INTRAVENOUS | Status: DC | PRN
  Administered 2018-05-07: 2 g via INTRAVENOUS

## 2018-05-07 MED ORDER — PROPOFOL 10 MG/ML IV BOLUS
INTRAVENOUS | Status: DC | PRN
  Administered 2018-05-07: 170 mg via INTRAVENOUS

## 2018-05-07 MED ORDER — FENTANYL CITRATE (PF) 250 MCG/5ML IJ SOLN
INTRAMUSCULAR | Status: DC | PRN
  Administered 2018-05-07 (×2): 50 ug via INTRAVENOUS
  Administered 2018-05-07 (×2): 100 ug via INTRAVENOUS

## 2018-05-07 MED ORDER — DEXAMETHASONE SODIUM PHOSPHATE 4 MG/ML IJ SOLN
INTRAMUSCULAR | Status: DC | PRN
  Administered 2018-05-07: 4 mg via INTRAVENOUS

## 2018-05-07 SURGICAL SUPPLY — 73 items
BANDAGE ACE 3X5.8 VEL STRL LF (GAUZE/BANDAGES/DRESSINGS) IMPLANT
BENZOIN TINCTURE PRP APPL 2/3 (GAUZE/BANDAGES/DRESSINGS) IMPLANT
BIT DRILL 1.1 (BIT) ×1
BIT DRILL 1.1MM (BIT) ×1
BIT DRILL 60X20X1.1XQC TMX (BIT) ×1 IMPLANT
BIT DRL 60X20X1.1XQC TMX (BIT) ×1
BLADE SURG 15 STRL LF DISP TIS (BLADE) ×2 IMPLANT
BLADE SURG 15 STRL SS (BLADE) ×4
BNDG ELASTIC 2X5.8 VLCR STR LF (GAUZE/BANDAGES/DRESSINGS) ×3 IMPLANT
BNDG ESMARK 4X9 LF (GAUZE/BANDAGES/DRESSINGS) ×3 IMPLANT
BNDG STRETCH 4X75 NS LF (GAUZE/BANDAGES/DRESSINGS) ×3 IMPLANT
CANISTER SUCT 1200ML W/VALVE (MISCELLANEOUS) IMPLANT
CLOSURE WOUND 1/2 X4 (GAUZE/BANDAGES/DRESSINGS)
CORD BIPOLAR FORCEPS 12FT (ELECTRODE) ×3 IMPLANT
COVER BACK TABLE 60X90IN (DRAPES) ×3 IMPLANT
COVER WAND RF STERILE (DRAPES) IMPLANT
CUFF TOURN SGL QUICK 18X4 (TOURNIQUET CUFF) ×3 IMPLANT
DECANTER SPIKE VIAL GLASS SM (MISCELLANEOUS) ×3 IMPLANT
DRAPE EXTREMITY T 121X128X90 (DISPOSABLE) ×3 IMPLANT
DRAPE OEC MINIVIEW 54X84 (DRAPES) ×3 IMPLANT
DRAPE SURG 17X23 STRL (DRAPES) ×3 IMPLANT
DRSG EMULSION OIL 3X3 NADH (GAUZE/BANDAGES/DRESSINGS) ×3 IMPLANT
GAUZE 4X4 16PLY RFD (DISPOSABLE) IMPLANT
GAUZE XEROFORM 1X8 LF (GAUZE/BANDAGES/DRESSINGS) IMPLANT
GLOVE BIO SURGEON STRL SZ 6.5 (GLOVE) ×2 IMPLANT
GLOVE BIO SURGEON STRL SZ7 (GLOVE) ×3 IMPLANT
GLOVE BIO SURGEON STRL SZ8 (GLOVE) IMPLANT
GLOVE BIO SURGEONS STRL SZ 6.5 (GLOVE) ×1
GLOVE BIOGEL PI IND STRL 6.5 (GLOVE) ×1 IMPLANT
GLOVE BIOGEL PI IND STRL 7.0 (GLOVE) ×2 IMPLANT
GLOVE BIOGEL PI IND STRL 7.5 (GLOVE) ×1 IMPLANT
GLOVE BIOGEL PI IND STRL 8.5 (GLOVE) ×1 IMPLANT
GLOVE BIOGEL PI INDICATOR 6.5 (GLOVE) ×2
GLOVE BIOGEL PI INDICATOR 7.0 (GLOVE) ×4
GLOVE BIOGEL PI INDICATOR 7.5 (GLOVE) ×2
GLOVE BIOGEL PI INDICATOR 8.5 (GLOVE) ×2
GLOVE ECLIPSE 6.5 STRL STRAW (GLOVE) ×3 IMPLANT
GLOVE SURG ORTHO 8.0 STRL STRW (GLOVE) ×6 IMPLANT
GOWN STRL REUS W/ TWL LRG LVL3 (GOWN DISPOSABLE) ×1 IMPLANT
GOWN STRL REUS W/ TWL XL LVL3 (GOWN DISPOSABLE) ×2 IMPLANT
GOWN STRL REUS W/TWL LRG LVL3 (GOWN DISPOSABLE) ×2
GOWN STRL REUS W/TWL XL LVL3 (GOWN DISPOSABLE) ×7 IMPLANT
MANIFOLD NEPTUNE II (INSTRUMENTS) ×3 IMPLANT
NEEDLE HYPO 25X1 1.5 SAFETY (NEEDLE) ×3 IMPLANT
NS IRRIG 1000ML POUR BTL (IV SOLUTION) ×3 IMPLANT
PACK BASIN DAY SURGERY FS (CUSTOM PROCEDURE TRAY) ×3 IMPLANT
PAD CAST 3X4 CTTN HI CHSV (CAST SUPPLIES) ×1 IMPLANT
PADDING CAST ABS 3INX4YD NS (CAST SUPPLIES) ×2
PADDING CAST ABS COTTON 3X4 (CAST SUPPLIES) ×1 IMPLANT
PADDING CAST COTTON 3X4 STRL (CAST SUPPLIES) ×2
PADDING UNDERCAST 2 STRL (CAST SUPPLIES)
PADDING UNDERCAST 2X4 STRL (CAST SUPPLIES) IMPLANT
PLATE STRAIGHT LOCK 1.5 (Plate) ×3 IMPLANT
SCREW LOCKING 1.5X10 (Screw) ×3 IMPLANT
SCREW LOCKING 1.5X11MM (Screw) ×6 IMPLANT
SCREW NONIOC 1.5 10M (Screw) ×9 IMPLANT
SHEET MEDIUM DRAPE 40X70 STRL (DRAPES) IMPLANT
SPLINT FIBERGLASS 3X35 (CAST SUPPLIES) ×3 IMPLANT
STOCKINETTE 4X48 STRL (DRAPES) ×3 IMPLANT
STRIP CLOSURE SKIN 1/2X4 (GAUZE/BANDAGES/DRESSINGS) IMPLANT
SUCTION FRAZIER HANDLE 10FR (MISCELLANEOUS) ×2
SUCTION TUBE FRAZIER 10FR DISP (MISCELLANEOUS) ×1 IMPLANT
SUT MNCRL AB 3-0 PS2 18 (SUTURE) ×3 IMPLANT
SUT MON AB 4-0 PC3 18 (SUTURE) IMPLANT
SUT PROLENE 4 0 PS 2 18 (SUTURE) ×3 IMPLANT
SUT VIC AB 3-0 FS2 27 (SUTURE) ×3 IMPLANT
SYR BULB 3OZ (MISCELLANEOUS) ×3 IMPLANT
SYR CONTROL 10ML LL (SYRINGE) ×3 IMPLANT
TOWEL GREEN STERILE FF (TOWEL DISPOSABLE) ×6 IMPLANT
TRAY DSU PREP LF (CUSTOM PROCEDURE TRAY) ×3 IMPLANT
TUBE CONNECTING 20'X1/4 (TUBING) ×1
TUBE CONNECTING 20X1/4 (TUBING) ×2 IMPLANT
UNDERPAD 30X30 (UNDERPADS AND DIAPERS) ×3 IMPLANT

## 2018-05-07 NOTE — Discharge Instructions (Signed)
°  Post Anesthesia Home Care Instructions  Activity: Get plenty of rest for the remainder of the day. A responsible individual must stay with you for 24 hours following the procedure.  For the next 24 hours, DO NOT: -Drive a car -Advertising copywriter -Drink alcoholic beverages -Take any medication unless instructed by your physician -Make any legal decisions or sign important papers.  Meals: Start with liquid foods such as gelatin or soup. Progress to regular foods as tolerated. Avoid greasy, spicy, heavy foods. If nausea and/or vomiting occur, drink only clear liquids until the nausea and/or vomiting subsides. Call your physician if vomiting continues.  Special Instructions/Symptoms: Your throat may feel dry or sore from the anesthesia or the breathing tube placed in your throat during surgery. If this causes discomfort, gargle with warm salt water. The discomfort should disappear within 24 hours.  If you had a scopolamine patch placed behind your ear for the management of post- operative nausea and/or vomiting:  1. The medication in the patch is effective for 72 hours, after which it should be removed.  Wrap patch in a tissue and discard in the trash. Wash hands thoroughly with soap and water. 2. You may remove the patch earlier than 72 hours if you experience unpleasant side effects which may include dry mouth, dizziness or visual disturbances. 3. Avoid touching the patch. Wash your hands with soap and water after contact with the patch.       KEEP BANDAGE CLEAN AND DRY CALL OFFICE FOR F/U APPT [phone] in 11 days KEEP HAND ELEVATED ABOVE HEART OK TO APPLY ICE TO OPERATIVE AREA CONTACT OFFICE IF ANY WORSENING PAIN OR CONCERNS.

## 2018-05-07 NOTE — Anesthesia Preprocedure Evaluation (Addendum)
Anesthesia Evaluation  Patient identified by MRN, date of birth, ID band Patient awake    Reviewed: Allergy & Precautions, NPO status , Patient's Chart, lab work & pertinent test results  Airway Mallampati: II  TM Distance: >3 FB Neck ROM: Full    Dental  (+) Teeth Intact, Dental Advisory Given   Pulmonary Current Smoker,    Pulmonary exam normal breath sounds clear to auscultation       Cardiovascular negative cardio ROS Normal cardiovascular exam Rhythm:Regular Rate:Normal     Neuro/Psych PSYCHIATRIC DISORDERS Anxiety negative neurological ROS     GI/Hepatic negative GI ROS, (+)     substance abuse  cocaine use and IV drug use, Hepatitis -, C  Endo/Other  negative endocrine ROS  Renal/GU negative Renal ROS     Musculoskeletal LEFT HAND 5TH METACARPAL FRACTURE   Abdominal   Peds  Hematology negative hematology ROS (+)   Anesthesia Other Findings Day of surgery medications reviewed with the patient.  Reproductive/Obstetrics                             Anesthesia Physical Anesthesia Plan  ASA: III  Anesthesia Plan: General   Post-op Pain Management:    Induction: Intravenous  PONV Risk Score and Plan: 3 and Midazolam, Dexamethasone and Ondansetron  Airway Management Planned: Oral ETT  Additional Equipment:   Intra-op Plan:   Post-operative Plan: Extubation in OR  Informed Consent: I have reviewed the patients History and Physical, chart, labs and discussed the procedure including the risks, benefits and alternatives for the proposed anesthesia with the patient or authorized representative who has indicated his/her understanding and acceptance.     Dental advisory given  Plan Discussed with: CRNA  Anesthesia Plan Comments:         Anesthesia Quick Evaluation

## 2018-05-07 NOTE — Transfer of Care (Signed)
Immediate Anesthesia Transfer of Care Note  Patient: Becky Ray  Procedure(s) Performed: LEFT HAND OPEN REDUCTION INTERNAL FIXATION (ORIF) WITH REPAIR  OF 5TH METACARPAL FRACTURE (Left Hand)  Patient Location: PACU  Anesthesia Type:General  Level of Consciousness: awake, alert , oriented and patient cooperative  Airway & Oxygen Therapy: Patient Spontanous Breathing and Patient connected to face mask oxygen  Post-op Assessment: Report given to RN, Post -op Vital signs reviewed and stable and Patient moving all extremities  Post vital signs: Reviewed and stable  Last Vitals:  Vitals Value Taken Time  BP 125/82 05/07/2018  2:09 PM  Temp    Pulse 88 05/07/2018  2:11 PM  Resp 23 05/07/2018  2:11 PM  SpO2 100 % 05/07/2018  2:11 PM  Vitals shown include unvalidated device data.  Last Pain:  Vitals:   05/07/18 1216  TempSrc: Oral  PainSc: 9          Complications: No apparent anesthesia complications

## 2018-05-07 NOTE — Anesthesia Procedure Notes (Signed)
Procedure Name: Intubation Date/Time: 05/07/2018 1:25 PM Performed by: Claudina Lick, CRNA Pre-anesthesia Checklist: Patient identified, Emergency Drugs available, Suction available, Patient being monitored and Timeout performed Patient Re-evaluated:Patient Re-evaluated prior to induction Oxygen Delivery Method: Circle system utilized Preoxygenation: Pre-oxygenation with 100% oxygen Induction Type: IV induction, Cricoid Pressure applied and Rapid sequence Laryngoscope Size: Miller and 2 Grade View: Grade I Tube type: Oral Tube size: 7.0 mm Number of attempts: 1 Airway Equipment and Method: Stylet Placement Confirmation: ETT inserted through vocal cords under direct vision,  positive ETCO2 and breath sounds checked- equal and bilateral Secured at: 22 cm Dental Injury: Teeth and Oropharynx as per pre-operative assessment

## 2018-05-07 NOTE — Op Note (Signed)
PREOPERATIVE DIAGNOSIS: Left small finger metacarpal shaft fracture  POSTOPERATIVE DIAGNOSIS: Same  ATTENDING SURGEON: Dr. Bradly Bienenstock who is scrubbed and present for the entire procedure  ASSISTANT SURGEON: Lambert Mody, PA-C was scrubbed in necessary for the entire procedure to help aid in reduction internal fixation closure and splinting  ANESTHESIA: General via endotracheal tube  OPERATIVE PROCEDURE: #1: Open treatment of left small finger metacarpal shaft fracture requiring internal fixation #2: Radiographs 3 views left hand  IMPLANTS: Biomet 6-hole plate combination of locking nonlocking screws  RADIOGRAPHIC INTERPRETATION: AP lateral and oblique views of the hand do show the dorsal plate fixation in place in good position  SURGICAL INDICATIONS: Patient is a right-hand-dominant female who sustained a closed small finger metacarpal fracture.  Patient was seen evaluate the office and recommended undergo the above procedure.  Risks of surgery include but not limited to bleeding infection damage nearby nerves arteries or tendons loss of motion of the wrist and digits incomplete relief of symptoms and need for further surgical intervention.  SURGICAL TECHNIQUE: Patient was palpated and found the preoperative holding area marked with a permanent marker made in the left hand indicate the correct operative site.  Patient brought back the operating placed supine on the anesthesia table where the general anesthetic was administered.  Preoperative antibiotics were given prior to any skin incision.  A well-padded tourniquet was then placed on the left brachium and seal with the appropriate drape.  The left upper extremities then prepped and draped normal sterile fashion.  A timeout was called the correct site was identified the procedure then begun.  A longitudinal incision made directly over the dorsal aspect of the small finger metacarpal dissection carried down through the skin and subcutaneous  tissue.  The extensor tendons were then carefully identified retracting these out of the way the fascia layer was incised directly over the fracture.  Fracture hematoma was then evacuated the fracture exposed.  Open reduction was then performed.  The 6-hole plate was then applied on the dorsal surface of the bone.  Reduction was held.  Following this compression plating was then carried out with a 1.1 drill bit and the 1.5 millimeter screws.  Total of 3 screws proximally 3 screws distal to the fracture site.  Final radiographs were then obtained.  The wound was thoroughly irrigated.  The fascia layer was then closed with Vicryl suture and subcutaneous tissues closed with Monocryl.  Skin was closed with a running 4-0 subcuticular Prolene.  Steri-Strips were applied.  A sterile compressive bandage then applied.  Patient was then placed in a well-padded ulnar gutter splint.  Extubated taken recovery in good condition.  POSTOPERATIVE PLAN: Patient be discharged to home.  See him back in the office in 10 days for wound check suture removal down to see the therapist for ulnar gutter splint begin a postoperative ORIF metacarpal protocol.  Radiographs at each visit.

## 2018-05-08 NOTE — Anesthesia Postprocedure Evaluation (Signed)
Anesthesia Post Note  Patient: Becky Ray  Procedure(s) Performed: LEFT HAND OPEN REDUCTION INTERNAL FIXATION (ORIF) WITH REPAIR  OF 5TH METACARPAL FRACTURE (Left Hand)     Patient location during evaluation: PACU Anesthesia Type: General Level of consciousness: awake and alert Pain management: pain level controlled Vital Signs Assessment: post-procedure vital signs reviewed and stable Respiratory status: spontaneous breathing, nonlabored ventilation and respiratory function stable Cardiovascular status: blood pressure returned to baseline and stable Postop Assessment: no apparent nausea or vomiting Anesthetic complications: no    Last Vitals:  Vitals:   05/07/18 1515 05/07/18 1558  BP: 97/67 112/78  Pulse: 71 74  Resp: 17 15  Temp:  37.1 C  SpO2: 98% 97%    Last Pain:  Vitals:   05/08/18 0942  TempSrc:   PainSc: 8                  Cecile Hearing

## 2018-05-09 ENCOUNTER — Encounter (HOSPITAL_BASED_OUTPATIENT_CLINIC_OR_DEPARTMENT_OTHER): Payer: Self-pay | Admitting: Orthopedic Surgery

## 2018-10-01 ENCOUNTER — Inpatient Hospital Stay (HOSPITAL_COMMUNITY)
Admission: AD | Admit: 2018-10-01 | Discharge: 2018-10-01 | Disposition: A | Discharge status: Home / Self Care | Payer: BLUE CROSS/BLUE SHIELD | Attending: Obstetrics & Gynecology | Admitting: Obstetrics & Gynecology

## 2018-10-01 ENCOUNTER — Other Ambulatory Visit: Payer: Self-pay

## 2018-10-01 ENCOUNTER — Inpatient Hospital Stay (HOSPITAL_COMMUNITY): Payer: BLUE CROSS/BLUE SHIELD

## 2018-10-01 ENCOUNTER — Encounter (HOSPITAL_COMMUNITY): Payer: Self-pay

## 2018-10-01 DIAGNOSIS — O26899 Other specified pregnancy related conditions, unspecified trimester: Secondary | ICD-10-CM | POA: Diagnosis present

## 2018-10-01 DIAGNOSIS — O26891 Other specified pregnancy related conditions, first trimester: Secondary | ICD-10-CM | POA: Diagnosis not present

## 2018-10-01 DIAGNOSIS — O3680X0 Pregnancy with inconclusive fetal viability, not applicable or unspecified: Secondary | ICD-10-CM | POA: Diagnosis present

## 2018-10-01 DIAGNOSIS — R1032 Left lower quadrant pain: Secondary | ICD-10-CM | POA: Insufficient documentation

## 2018-10-01 DIAGNOSIS — Z3A10 10 weeks gestation of pregnancy: Secondary | ICD-10-CM | POA: Insufficient documentation

## 2018-10-01 DIAGNOSIS — Z87891 Personal history of nicotine dependence: Secondary | ICD-10-CM | POA: Diagnosis not present

## 2018-10-01 DIAGNOSIS — R109 Unspecified abdominal pain: Secondary | ICD-10-CM

## 2018-10-01 LAB — WET PREP, GENITAL
Clue Cells Wet Prep HPF POC: NONE SEEN
Sperm: NONE SEEN
Trich, Wet Prep: NONE SEEN
Yeast Wet Prep HPF POC: NONE SEEN

## 2018-10-01 LAB — URINALYSIS, ROUTINE W REFLEX MICROSCOPIC
Bilirubin Urine: NEGATIVE
Glucose, UA: NEGATIVE mg/dL
Hgb urine dipstick: NEGATIVE
Ketones, ur: NEGATIVE mg/dL
Leukocytes,Ua: NEGATIVE
Nitrite: NEGATIVE
Protein, ur: NEGATIVE mg/dL
Specific Gravity, Urine: 1.025 (ref 1.005–1.030)
pH: 6 (ref 5.0–8.0)

## 2018-10-01 LAB — CBC
HCT: 37.1 % (ref 36.0–46.0)
Hemoglobin: 12.8 g/dL (ref 12.0–15.0)
MCH: 28.9 pg (ref 26.0–34.0)
MCHC: 34.5 g/dL (ref 30.0–36.0)
MCV: 83.7 fL (ref 80.0–100.0)
Platelets: 206 10*3/uL (ref 150–400)
RBC: 4.43 MIL/uL (ref 3.87–5.11)
RDW: 12.3 % (ref 11.5–15.5)
WBC: 6.1 10*3/uL (ref 4.0–10.5)
nRBC: 0 % (ref 0.0–0.2)

## 2018-10-01 LAB — HCG, QUANTITATIVE, PREGNANCY: hCG, Beta Chain, Quant, S: 3624 m[IU]/mL — ABNORMAL HIGH (ref <5)

## 2018-10-01 LAB — POCT PREGNANCY, URINE: Preg Test, Ur: POSITIVE — AB

## 2018-10-01 NOTE — Discharge Instructions (Signed)

## 2018-10-01 NOTE — MAU Note (Signed)
Pt here for lower abdominal cramping that started last night, but went away. Started back around 2pm. Has not tried anything. Denies bleeding but reports vaginal discharge that is thick and white. Denies odor or itching. LMP: unsure, but thinks maybe in the beginning of June. Has irregular periods. Reports a +upt at home last week. Had a confirmation at the health dept.

## 2018-10-01 NOTE — MAU Provider Note (Signed)
History     CSN: [id number]  Arrival date and time: 10/01/18 1805   First Provider Initiated Contact with Patient 10/01/18 2015      Chief Complaint  Patient presents with  . Abdominal Pain   HPI  Ms.  Becky Ray is a 38 y.o. year old 184P1021 female at 4781w2d weeks gestation who presents to MAU reporting lower, left abdominal pain/cramping that started 09/30/18 PM, but went away. She reports that the abdominal pain/cramping started back at 1400 today; worsens with SI. She has not taken any medications or any comfort measures for the pain. She denies VB, but reports a thick, white vaginal d/c with no odor or itching. She is unsure of her LMP; she has irregular periods. She states her period may have been possibly the beginning of June. She had a (+) HPT at home and [facility]  Past Medical History:  Diagnosis Date  . Anxiety   . Chronic back pain   . Hepatitis    Hep C -  no treatment  . Hyperlipidemia    diet control  . Insomnia   . Pneumonia 2000   hx x 1 - 20 yrs ago in 2000  . Seasonal allergies   . SVD (spontaneous vaginal delivery)    x 1    Past Surgical History:  Procedure Laterality Date  . OPEN REDUCTION INTERNAL FIXATION (ORIF) HAND Left 05/07/2018   Procedure: LEFT HAND OPEN REDUCTION INTERNAL FIXATION (ORIF) WITH REPAIR  OF 5TH METACARPAL FRACTURE;  Surgeon: Bradly Bienenstockrtmann, Fred, MD;  Location: [facility];  Service: Orthopedics;  Laterality: Left;  . WISDOM TOOTH EXTRACTION      History reviewed. No pertinent family history.  Social History   Tobacco Use  . Smoking status: Former Smoker    Packs/day: 0.00    Years: 20.00    Pack years: 0.00    Types: Cigarettes    Quit date: 02/19/2018    Years since quitting: 0.6  . Smokeless tobacco: Never Used  . Tobacco comment: 0.5-1.0 ppd  Substance Use Topics  . Alcohol use: Not Currently    Alcohol/week: 5.0 - 6.0 standard drinks    Types: 5 - 6 Glasses of wine per week    Comment: wine  .  Drug use: Not Currently    Types: IV, Heroin    Comment: heroin one week ago, has not used cocaine in over a year    Allergies: No Known Allergies  Medications Prior to Admission  Medication Sig Dispense Refill Last Dose  . cetirizine (ZYRTEC) 10 MG tablet Take 10 mg by mouth 2 (two) times daily as needed for allergies.   Past Month at Unknown time  . gabapentin (NEURONTIN) 300 MG capsule Take 600-1,200 mg by mouth See admin instructions. Take 2 capsule (600 mg) by mouth 3 times daily after each meal & take 4 capsules (1200 mg) by mouth at bedtime.   10/01/2018 at Unknown time  . LUBRICANT EYE DROPS 0.4-0.3 % SOLN Place 1 drop into both eyes daily. Allergies   10/01/2018 at Unknown time  . methadone (DOLOPHINE) 10 MG/5ML solution Take by mouth. Pt states that she get 85 mg per day liquid solution. Pt is unsure how many ml   10/01/2018 at Unknown time  . Multiple Vitamin (MULTIVITAMIN WITH MINERALS) TABS tablet Take 1 tablet by mouth daily.   10/01/2018 at Unknown time  . traZODone (DESYREL) 100 MG tablet Take 100 mg by mouth at bedtime.   09/30/2018  at Unknown time  . buprenorphine (SUBUTEX) 8 MG SUBL SL tablet Place 8 mg under the tongue 3 (three) times daily.   More than a month at Unknown time  . ibuprofen (ADVIL,MOTRIN) 200 MG tablet Take 400 mg by mouth every 8 (eight) hours as needed (for headaches.).   More than a month at Unknown time    Review of Systems Physical Exam   Blood pressure 105/62, pulse 80, temperature 98.3 F (36.8 C), temperature source Oral, resp. rate 18, height 6' (1.829 m), weight 72.3 kg, last menstrual period 07/21/2018, SpO2 100 %.  Physical Exam  MAU Course  Procedures  MDM CCUA UPT CBC ABO/Rh HCG Wet Prep GC/CT -- pending HIV -- pending OB < 14 wks [place] with TV  Results for orders placed or performed during the hospital encounter of 10/01/18 (from the past 24 hour(s))  Urinalysis, Routine w reflex microscopic     Status: Abnormal   Collection Time:  10/01/18  7:39 PM  Result Value Ref Range   Color, Urine YELLOW YELLOW   APPearance HAZY (A) CLEAR   Specific Gravity, Urine 1.025 1.005 - 1.030   pH 6.0 5.0 - 8.0   Glucose, UA NEGATIVE NEGATIVE mg/dL   Hgb urine dipstick NEGATIVE NEGATIVE   Bilirubin Urine NEGATIVE NEGATIVE   Ketones, ur NEGATIVE NEGATIVE mg/dL   Protein, ur NEGATIVE NEGATIVE mg/dL   Nitrite NEGATIVE NEGATIVE   Leukocytes,Ua NEGATIVE NEGATIVE  Pregnancy, urine POC     Status: Abnormal   Collection Time: 10/01/18  7:40 PM  Result Value Ref Range   Preg Test, Ur POSITIVE (A) NEGATIVE  CBC     Status: None   Collection Time: 10/01/18  8:08 PM  Result Value Ref Range   WBC 6.1 4.0 - 10.5 K/uL   RBC 4.43 3.87 - 5.11 MIL/uL   Hemoglobin 12.8 12.0 - 15.0 g/dL   HCT 37.1 36.0 - 46.0 %   MCV 83.7 80.0 - 100.0 fL   MCH 28.9 26.0 - 34.0 pg   MCHC 34.5 30.0 - 36.0 g/dL   RDW 12.3 11.5 - 15.5 %   Platelets 206 150 - 400 K/uL   nRBC 0.0 0.0 - 0.2 %  hCG, quantitative, pregnancy     Status: Abnormal   Collection Time: 10/01/18  8:08 PM  Result Value Ref Range   hCG, Beta Chain, Quant, S 3,624 (H) <5 mIU/mL  Wet prep, genital     Status: Abnormal   Collection Time: 10/01/18  8:28 PM   Specimen: Cervix  Result Value Ref Range   Yeast Wet Prep HPF POC NONE SEEN NONE SEEN   Trich, Wet Prep NONE SEEN NONE SEEN   Clue Cells Wet Prep HPF POC NONE SEEN NONE SEEN   WBC, Wet Prep HPF POC MODERATE (A) NONE SEEN   Sperm NONE SEEN     US Ob Less Than 14 Weeks With Ob Transvaginal  Result Date: 10/01/2018 CLINICAL DATA:  Cramping EXAM: OBSTETRIC <14 WK [place] AND TRANSVAGINAL OB US TECHNIQUE: Both transabdominal and transvaginal ultrasound examinations were performed for complete evaluation of the gestation as well as the maternal uterus, adnexal regions, and pelvic cul-de-sac. Transvaginal technique was performed to assess early pregnancy. COMPARISON:  None. FINDINGS: Intrauterine gestational sac: Single Yolk sac:  Visualized.  Embryo:  Not Visualized. Cardiac Activity: Not Visualized. MSD: 8.5 mm   5 w   4 d Subchorionic hemorrhage:  None visualized. Maternal uterus/adnexae: The maternal uterus and adnexa are normal in appearance. A  probable corpus luteum cyst seen within the right ovary. IMPRESSION: Probable early intrauterine gestational sac and yolk sac, but no fetal pole, or cardiac activity yet visualized. Recommend follow-up quantitative B-HCG levels and follow-up US in 14 days to assess viability. This recommendation follows SRU consensus guidelines: Diagnostic Criteria for Nonviable Pregnancy Early in the First Trimester. Malva Limes Engl J Med 2013; 161:0960-45; 369:1443-51. Electronically Signed   By: Jonna ClarkBindu  Avutu M.D.   On: 10/01/2018 21:21     Assessment and Plan  Pregnancy with uncertain fetal viability, single or unspecified fetus  - Outpatient US OB Transvaginal ordered for 2 wks to confirm viability - Advised that dating cannot be determined from IUGS alone, dating will need to be set when there is an embryo to measure - [facility] by [facility]  Abdominal pain in pregnancy  - Advised to take Tylenol 1000 mg every 6 hrs prn pain - Advised to return to MAU, if pain unrelieved 2 hrs after taking Tylenol - Information provided on abd pain in pregnancy  - Discharge patient - Expect a call to schedule F/U U/S - Patient verbalized an understanding of the plan of care and agrees.    Raelyn Moraolitta Vrishank Moster, MSN, CNM 10/01/2018, 8:15 PM

## 2018-10-02 LAB — HIV ANTIBODY (ROUTINE TESTING W REFLEX): HIV Screen 4th Generation wRfx: NONREACTIVE

## 2018-10-03 LAB — GC/CHLAMYDIA PROBE AMP (~~LOC~~) NOT AT ARMC
Chlamydia: NEGATIVE
Neisseria Gonorrhea: NEGATIVE

## 2018-10-06 ENCOUNTER — Encounter: Payer: Self-pay | Admitting: *Deleted

## 2018-10-06 LAB — PREGNANCY, URINE

## 2018-10-15 ENCOUNTER — Encounter (HOSPITAL_COMMUNITY): Payer: Self-pay

## 2018-10-15 ENCOUNTER — Inpatient Hospital Stay (HOSPITAL_COMMUNITY): Payer: BLUE CROSS/BLUE SHIELD

## 2018-10-15 ENCOUNTER — Other Ambulatory Visit: Payer: Self-pay

## 2018-10-15 ENCOUNTER — Ambulatory Visit (HOSPITAL_COMMUNITY): Admission: RE | Admit: 2018-10-15 | Payer: BLUE CROSS/BLUE SHIELD | Source: Ambulatory Visit

## 2018-10-15 ENCOUNTER — Inpatient Hospital Stay (HOSPITAL_COMMUNITY)
Admission: AD | Admit: 2018-10-15 | Discharge: 2018-10-15 | Disposition: A | Discharge status: Home / Self Care | Payer: BLUE CROSS/BLUE SHIELD | Attending: Obstetrics and Gynecology | Admitting: Obstetrics and Gynecology

## 2018-10-15 DIAGNOSIS — O99281 Endocrine, nutritional and metabolic diseases complicating pregnancy, first trimester: Secondary | ICD-10-CM | POA: Diagnosis not present

## 2018-10-15 DIAGNOSIS — E785 Hyperlipidemia, unspecified: Secondary | ICD-10-CM | POA: Insufficient documentation

## 2018-10-15 DIAGNOSIS — R109 Unspecified abdominal pain: Secondary | ICD-10-CM | POA: Insufficient documentation

## 2018-10-15 DIAGNOSIS — Z79899 Other long term (current) drug therapy: Secondary | ICD-10-CM | POA: Diagnosis not present

## 2018-10-15 DIAGNOSIS — Z3A01 Less than 8 weeks gestation of pregnancy: Secondary | ICD-10-CM | POA: Insufficient documentation

## 2018-10-15 DIAGNOSIS — Z87891 Personal history of nicotine dependence: Secondary | ICD-10-CM | POA: Diagnosis not present

## 2018-10-15 DIAGNOSIS — O26891 Other specified pregnancy related conditions, first trimester: Secondary | ICD-10-CM | POA: Diagnosis not present

## 2018-10-15 DIAGNOSIS — O4690 Antepartum hemorrhage, unspecified, unspecified trimester: Secondary | ICD-10-CM

## 2018-10-15 DIAGNOSIS — O26899 Other specified pregnancy related conditions, unspecified trimester: Secondary | ICD-10-CM

## 2018-10-15 DIAGNOSIS — O209 Hemorrhage in early pregnancy, unspecified: Secondary | ICD-10-CM | POA: Diagnosis present

## 2018-10-15 DIAGNOSIS — O3680X0 Pregnancy with inconclusive fetal viability, not applicable or unspecified: Secondary | ICD-10-CM | POA: Diagnosis not present

## 2018-10-15 DIAGNOSIS — O200 Threatened abortion: Secondary | ICD-10-CM | POA: Insufficient documentation

## 2018-10-15 DIAGNOSIS — O4691 Antepartum hemorrhage, unspecified, first trimester: Secondary | ICD-10-CM

## 2018-10-15 LAB — URINALYSIS, ROUTINE W REFLEX MICROSCOPIC
Bilirubin Urine: NEGATIVE
Glucose, UA: NEGATIVE mg/dL
Hgb urine dipstick: NEGATIVE
Ketones, ur: NEGATIVE mg/dL
Leukocytes,Ua: NEGATIVE
Nitrite: NEGATIVE
Protein, ur: NEGATIVE mg/dL
Specific Gravity, Urine: 1.019 (ref 1.005–1.030)
pH: 5 (ref 5.0–8.0)

## 2018-10-15 LAB — CBC
HCT: 38.6 % (ref 36.0–46.0)
Hemoglobin: 13.6 g/dL (ref 12.0–15.0)
MCH: 29.1 pg (ref 26.0–34.0)
MCHC: 35.2 g/dL (ref 30.0–36.0)
MCV: 82.5 fL (ref 80.0–100.0)
Platelets: 225 10*3/uL (ref 150–400)
RBC: 4.68 MIL/uL (ref 3.87–5.11)
RDW: 12.1 % (ref 11.5–15.5)
WBC: 5.8 10*3/uL (ref 4.0–10.5)
nRBC: 0 % (ref 0.0–0.2)

## 2018-10-15 LAB — HCG, QUANTITATIVE, PREGNANCY: hCG, Beta Chain, Quant, S: 6663 m[IU]/mL — ABNORMAL HIGH (ref <5)

## 2018-10-15 NOTE — Discharge Instructions (Signed)
Threatened Miscarriage ° °A threatened miscarriage is when you have bleeding from your vagina during the first 20 weeks of pregnancy but the pregnancy does not end. Your doctor will do tests to make sure you are still pregnant. The cause of the bleeding may not be known. This condition does not mean your pregnancy will end, but it does increase the risk that it will end (complete miscarriage). °Follow these instructions at home: °· Get plenty of rest. °· If you have bleeding in your vagina, do not have sex or use tampons. °· Do not douche. °· Do not smoke or use drugs. °· Do not drink alcohol. °· Avoid caffeine. °· Keep all follow-up prenatal visits as told by your doctor. This is important. °Contact a doctor if: °· You have light bleeding from your vagina. °· You have belly pain or cramping. °· You have a fever. °Get help right away if: °· You have heavy bleeding from your vagina. °· You have clots of blood coming from your vagina. °· You pass tissue from your vagina. °· You have a gush of fluid from your vagina. °· You are leaking fluid from your vagina. °· You have very bad pain or cramps in your low back or belly. °· You have fever, chills, and very bad belly pain. °Summary °· A threatened miscarriage is when you have bleeding from your vagina during the first 20 weeks of pregnancy but the pregnancy does not end. °· This condition does not mean your pregnancy will end, but it does increase the risk that it will end (complete miscarriage). °· Get plenty of rest. If you have bleeding in your vagina, do not have sex or use tampons. °· Keep all follow-up prenatal visits as told by your doctor. This is important. °This information is not intended to replace advice given to you by your health care provider. Make sure you discuss any questions you have with your health care provider. °Document Released: 01/19/2008 Document Revised: 03/14/2017 Document Reviewed: 05/04/2016 °Elsevier Patient Education © 2020 Elsevier  Inc. ° °

## 2018-10-15 NOTE — MAU Note (Signed)
.   Becky Ray is a 38 y.o. at [redacted]w[redacted]d here in MAU reporting: lower abdominal cramping and vaginal bleeding for a couple of days but the cramping got worse this morning.   Onset of complaint: couple of days Pain score: 6 Vitals:   10/15/18 1242  BP: 100/68  Resp: 16  Temp: 98.2 F (36.8 C)  SpO2: 100%      Lab orders placed from triage: UA

## 2018-10-15 NOTE — MAU Provider Note (Addendum)
History     CSN: [id number]  Arrival date and time: 10/15/18 1152   First Provider Initiated Contact with Patient 10/15/18 1309      Chief Complaint  Patient presents with  . Vaginal Bleeding  . Abdominal Pain   Becky Ray is a 38 y.o G90P1021 Female who is [redacted]w[redacted]d pregnant by early ultrasound who presents with vaginal bleeding. Bleeding started a few days ago. She has also had some abdominal cramping for the past few days. She also had some nausea but no significant vomiting. Has a history of headaches but no headaches currently. Last had sexual intercourse a few weeks ago. Denies flank pain, leg swelling, or vision changes.   OB History    Gravida  4   Para  1   Term  1   Preterm      AB  2   Living  1     SAB  1   TAB  1   Ectopic      Multiple      Live Births  1           Past Medical History:  Diagnosis Date  . Anxiety   . Chronic back pain   . Hepatitis    Hep C -  no treatment  . Hyperlipidemia    diet control  . Insomnia   . Pneumonia 2000   hx x 1 - 20 yrs ago in 2000  . Seasonal allergies   . SVD (spontaneous vaginal delivery)    x 1    Past Surgical History:  Procedure Laterality Date  . OPEN REDUCTION INTERNAL FIXATION (ORIF) HAND Left 05/07/2018   Procedure: LEFT HAND OPEN REDUCTION INTERNAL FIXATION (ORIF) WITH REPAIR  OF 5TH METACARPAL FRACTURE;  Surgeon: Bradly Bienenstock, MD;  [facility];  Service: Orthopedics;  Laterality: Left;  . WISDOM TOOTH EXTRACTION      No family history on file.  Social History   Tobacco Use  . Smoking status: Former Smoker    Packs/day: 0.00    Years: 20.00    Pack years: 0.00    Types: Cigarettes    Quit date: 02/19/2018    Years since quitting: 0.6  . Smokeless tobacco: Never Used  . Tobacco comment: 0.5-1.0 ppd  Substance Use Topics  . Alcohol use: Not Currently    Alcohol/week: 5.0 - 6.0 standard drinks    Types: 5 - 6 Glasses of wine per week    Comment: wine  .  Drug use: Not Currently    Types: IV, Heroin    Comment: heroin one week ago, has not used cocaine in over a year    Allergies: No Known Allergies  Medications Prior to Admission  Medication Sig Dispense Refill Last Dose  . acetaminophen (TYLENOL) 500 MG tablet Take 500 mg by mouth every 6 (six) hours as needed for moderate pain.   10/14/2018 at Unknown time  . gabapentin (NEURONTIN) 300 MG capsule Take 600-1,200 mg by mouth See admin instructions. Take 2 capsule (600 mg) by mouth 3 times daily after each meal & take 4 capsules (1200 mg) by mouth at bedtime.   10/15/2018 at Unknown time  . methadone (DOLOPHINE) 10 MG/5ML solution Take by mouth. Pt states that she get 85 mg per day liquid solution. Pt is unsure how many ml   10/15/2018 at Unknown time  . Multiple Vitamin (MULTIVITAMIN WITH MINERALS) TABS tablet Take 1 tablet by mouth daily.   10/15/2018 at Unknown  time  . traZODone (DESYREL) 100 MG tablet Take 100 mg by mouth at bedtime.   10/15/2018 at Unknown time  . buprenorphine (SUBUTEX) 8 MG SUBL SL tablet Place 8 mg under the tongue 3 (three) times daily.    at not taking  . cetirizine (ZYRTEC) 10 MG tablet Take 10 mg by mouth 2 (two) times daily as needed for allergies.   Unknown at Unknown time  . LUBRICANT EYE DROPS 0.4-0.3 % SOLN Place 1 drop into both eyes daily. Allergies       Review of Systems  Constitutional: Negative.   HENT: Negative.   Eyes: Negative.  Negative for visual disturbance.  Respiratory: Negative.   Cardiovascular: Negative.  Negative for leg swelling.  Gastrointestinal: Positive for abdominal pain (mild cramping) and nausea. Negative for abdominal distention, anal bleeding, blood in stool, constipation, diarrhea, rectal pain and vomiting.  Endocrine: Negative.   Genitourinary: Positive for vaginal bleeding. Negative for difficulty urinating, dyspareunia, dysuria, flank pain, frequency, hematuria, urgency, vaginal discharge and vaginal pain.  Musculoskeletal:  Negative.   Skin: Negative.   Allergic/Immunologic: Negative.   Neurological: Negative.   Hematological: Negative.   Psychiatric/Behavioral: Negative.    Physical Exam   Blood pressure 109/66, pulse 61, temperature 98.2 F (36.8 C), resp. rate 16, last menstrual period 07/21/2018, SpO2 100 %, unknown if currently breastfeeding.  Physical Exam  Constitutional: She appears well-developed and well-nourished.  Respiratory: Effort normal.  GI: Soft. She exhibits no distension and no mass. There is no abdominal tenderness. There is no rebound and no guarding.  Genitourinary:    Genitourinary Comments: Pink vaginal mucosa with dark red blood in vaginal vault. Cervical os is open.    Results for orders placed or performed during the hospital encounter of 10/15/18 (from the past 24 hour(s))  Urinalysis, Routine w reflex microscopic     Status: None   Collection Time: 10/15/18  1:31 PM  Result Value Ref Range   Color, Urine YELLOW YELLOW   APPearance CLEAR CLEAR   Specific Gravity, Urine 1.019 1.005 - 1.030   pH 5.0 5.0 - 8.0   Glucose, UA NEGATIVE NEGATIVE mg/dL   Hgb urine dipstick NEGATIVE NEGATIVE   Bilirubin Urine NEGATIVE NEGATIVE   Ketones, ur NEGATIVE NEGATIVE mg/dL   Protein, ur NEGATIVE NEGATIVE mg/dL   Nitrite NEGATIVE NEGATIVE   Leukocytes,Ua NEGATIVE NEGATIVE  CBC     Status: None   Collection Time: 10/15/18  2:16 PM  Result Value Ref Range   WBC 5.8 4.0 - 10.5 K/uL   RBC 4.68 3.87 - 5.11 MIL/uL   Hemoglobin 13.6 12.0 - 15.0 g/dL   HCT 38.6 36.0 - 46.0 %   MCV 82.5 80.0 - 100.0 fL   MCH 29.1 26.0 - 34.0 pg   MCHC 35.2 30.0 - 36.0 g/dL   RDW 12.1 11.5 - 15.5 %   Platelets 225 150 - 400 K/uL   nRBC 0.0 0.0 - 0.2 %  hCG, quantitative, pregnancy     Status: Abnormal   Collection Time: 10/15/18  2:16 PM  Result Value Ref Range   hCG, Beta Chain, Quant, S 6,663 (H) <5 mIU/mL    US Ob Transvaginal  Result Date: 10/15/2018 CLINICAL DATA:  Lower abdominal pain,  cramping for 2 days, vaginal bleeding. EXAM: TRANSVAGINAL OB ULTRASOUND TECHNIQUE: Transvaginal ultrasound was performed for complete evaluation of the gestation as well as the maternal uterus, adnexal regions, and pelvic cul-de-sac. COMPARISON:  Sonogram October 01, 2018 FINDINGS: GA by prior sonographic dating:  7 weeks 4 days Fall LMP: 07/21/2018 GA by LMP: 12 weeks 2 days EDC by LMP: 04/27/2019 Intrauterine gestational sac: Single Yolk sac:  Visualized. Embryo:  Visualized. Cardiac Activity: Visualized. Heart Rate: 60 bpm CRL:   3.0 mm   5 w 5 d                  US EDC: 06/12/2019 Subchorionic hemorrhage:  None visualized. Maternal uterus/adnexae: Retroverted uterus. Ovaries are unremarkable. No free fluid in the pelvis. IMPRESSION: Fetal bradycardia within irregular, lobular appearance of the gestational sac and discordant dates relative to prior sonographic dating. Such features could be seen in the setting of the threatened miscarriage. Recommend close clinical follow-up and short-term interval sonography. Electronically Signed   By: Kreg ShropshirePrice  DeHay M.D.   On: 10/15/2018 15:29      MAU Course  Procedures  MDM  UA: wnl hCG: [id number] CBC: wnl  US findings suggest threatened abortion with intrauterine gestational sac. No acute abdomen or ectopic pregnancy.   Assessment and Plan  Discharged home in stable condition with teaching on SAB symptoms. Return instructions given. F/u with OB office next week for ultrasound and hCG check.  Ann HeldNicole C Chang [facility] 10/15/2018, 3:49 PM   GME ATTESTATION:  I saw and evaluated the patient. I agree with the findings and the plan of care as documented in the medical student's note.  Marlowe AltHailey L Renate Danh, DO OB Fellow [facility] 10/15/18, 3:54 PM

## 2018-10-22 ENCOUNTER — Telehealth: Payer: Self-pay | Admitting: Family Medicine

## 2018-10-22 NOTE — Telephone Encounter (Signed)
Attempted to call patient about her appointment on 9/3 @ 10:40. No answer left voicemail instructing patient to wear a face mask for the entire appointment and no visitors are allowed during the visit. Patient instructed not to attend the appointment if she was any symptoms. Symptom list and office number left.

## 2018-10-23 ENCOUNTER — Ambulatory Visit (HOSPITAL_COMMUNITY): Admission: RE | Admit: 2018-10-23 | Payer: BLUE CROSS/BLUE SHIELD | Source: Ambulatory Visit

## 2018-10-23 ENCOUNTER — Ambulatory Visit: Payer: BLUE CROSS/BLUE SHIELD

## 2018-11-03 ENCOUNTER — Telehealth: Payer: Self-pay | Admitting: Family Medicine

## 2018-11-03 ENCOUNTER — Encounter: Payer: Self-pay | Admitting: Family Medicine

## 2018-11-03 NOTE — Telephone Encounter (Signed)
Attempted to reach patient about rescheduling her missed intake appointment. I was not able to reach her, or leave a message.

## 2018-11-04 ENCOUNTER — Encounter: Payer: BLUE CROSS/BLUE SHIELD | Admitting: Family Medicine

## 2018-12-18 ENCOUNTER — Other Ambulatory Visit: Payer: Self-pay

## 2018-12-18 NOTE — Telephone Encounter (Addendum)
[facility] requesting rx refill for prenatal vitamin. Per chart review pt has been contacted multiple times by staff with no response; pt has not established care in our office for this pregnancy. [facility] notified we will not refill this request.

## 2019-03-02 ENCOUNTER — Inpatient Hospital Stay (HOSPITAL_COMMUNITY)
Admission: AD | Admit: 2019-03-02 | Discharge: 2019-03-02 | Disposition: A | Discharge status: Home / Self Care | Payer: BLUE CROSS/BLUE SHIELD | Attending: Obstetrics and Gynecology | Admitting: Obstetrics and Gynecology

## 2019-03-02 ENCOUNTER — Encounter (HOSPITAL_COMMUNITY): Payer: Self-pay | Admitting: Obstetrics and Gynecology

## 2019-03-02 ENCOUNTER — Inpatient Hospital Stay (HOSPITAL_COMMUNITY): Payer: BLUE CROSS/BLUE SHIELD

## 2019-03-02 ENCOUNTER — Other Ambulatory Visit: Payer: Self-pay

## 2019-03-02 DIAGNOSIS — O99321 Drug use complicating pregnancy, first trimester: Secondary | ICD-10-CM | POA: Diagnosis not present

## 2019-03-02 DIAGNOSIS — F1120 Opioid dependence, uncomplicated: Secondary | ICD-10-CM | POA: Diagnosis not present

## 2019-03-02 DIAGNOSIS — Z79899 Other long term (current) drug therapy: Secondary | ICD-10-CM | POA: Insufficient documentation

## 2019-03-02 DIAGNOSIS — O26891 Other specified pregnancy related conditions, first trimester: Secondary | ICD-10-CM | POA: Diagnosis not present

## 2019-03-02 DIAGNOSIS — O208 Other hemorrhage in early pregnancy: Secondary | ICD-10-CM | POA: Insufficient documentation

## 2019-03-02 DIAGNOSIS — F419 Anxiety disorder, unspecified: Secondary | ICD-10-CM | POA: Insufficient documentation

## 2019-03-02 DIAGNOSIS — O99331 Smoking (tobacco) complicating pregnancy, first trimester: Secondary | ICD-10-CM | POA: Diagnosis not present

## 2019-03-02 DIAGNOSIS — Z79891 Long term (current) use of opiate analgesic: Secondary | ICD-10-CM

## 2019-03-02 DIAGNOSIS — Z3A01 Less than 8 weeks gestation of pregnancy: Secondary | ICD-10-CM | POA: Diagnosis not present

## 2019-03-02 DIAGNOSIS — O99341 Other mental disorders complicating pregnancy, first trimester: Secondary | ICD-10-CM | POA: Diagnosis not present

## 2019-03-02 DIAGNOSIS — O219 Vomiting of pregnancy, unspecified: Secondary | ICD-10-CM

## 2019-03-02 DIAGNOSIS — G4700 Insomnia, unspecified: Secondary | ICD-10-CM | POA: Diagnosis not present

## 2019-03-02 DIAGNOSIS — O99351 Diseases of the nervous system complicating pregnancy, first trimester: Secondary | ICD-10-CM | POA: Insufficient documentation

## 2019-03-02 DIAGNOSIS — O99281 Endocrine, nutritional and metabolic diseases complicating pregnancy, first trimester: Secondary | ICD-10-CM | POA: Insufficient documentation

## 2019-03-02 DIAGNOSIS — R109 Unspecified abdominal pain: Secondary | ICD-10-CM

## 2019-03-02 DIAGNOSIS — E785 Hyperlipidemia, unspecified: Secondary | ICD-10-CM | POA: Diagnosis not present

## 2019-03-02 DIAGNOSIS — Z3A11 11 weeks gestation of pregnancy: Secondary | ICD-10-CM

## 2019-03-02 DIAGNOSIS — R1030 Lower abdominal pain, unspecified: Secondary | ICD-10-CM

## 2019-03-02 DIAGNOSIS — F17210 Nicotine dependence, cigarettes, uncomplicated: Secondary | ICD-10-CM | POA: Insufficient documentation

## 2019-03-02 DIAGNOSIS — O210 Mild hyperemesis gravidarum: Secondary | ICD-10-CM | POA: Diagnosis present

## 2019-03-02 LAB — URINALYSIS, ROUTINE W REFLEX MICROSCOPIC
Bilirubin Urine: NEGATIVE
Glucose, UA: NEGATIVE mg/dL
Hgb urine dipstick: NEGATIVE
Ketones, ur: NEGATIVE mg/dL
Nitrite: NEGATIVE
Protein, ur: NEGATIVE mg/dL
Specific Gravity, Urine: 1.019 (ref 1.005–1.030)
pH: 5 (ref 5.0–8.0)

## 2019-03-02 LAB — CBC
HCT: 36.4 % (ref 36.0–46.0)
Hemoglobin: 12.2 g/dL (ref 12.0–15.0)
MCH: 27.3 pg (ref 26.0–34.0)
MCHC: 33.5 g/dL (ref 30.0–36.0)
MCV: 81.4 fL (ref 80.0–100.0)
Platelets: 231 10*3/uL (ref 150–400)
RBC: 4.47 MIL/uL (ref 3.87–5.11)
RDW: 13.4 % (ref 11.5–15.5)
WBC: 4.9 10*3/uL (ref 4.0–10.5)
nRBC: 0 % (ref 0.0–0.2)

## 2019-03-02 LAB — HCG, QUANTITATIVE, PREGNANCY: hCG, Beta Chain, Quant, S: 110093 m[IU]/mL — ABNORMAL HIGH (ref <5)

## 2019-03-02 LAB — WET PREP, GENITAL
Sperm: NONE SEEN
Trich, Wet Prep: NONE SEEN
Yeast Wet Prep HPF POC: NONE SEEN

## 2019-03-02 LAB — HIV ANTIBODY (ROUTINE TESTING W REFLEX): HIV Screen 4th Generation wRfx: NONREACTIVE

## 2019-03-02 LAB — POCT PREGNANCY, URINE: Preg Test, Ur: POSITIVE — AB

## 2019-03-02 MED ORDER — PROMETHAZINE HCL 25 MG PO TABS: 25.0000 mg | ORAL_TABLET | Freq: Four times a day (QID) | ORAL | 0 refills | Status: DC | PRN

## 2019-03-02 MED ORDER — DOXYLAMINE-PYRIDOXINE 10-10 MG PO TBEC: DELAYED_RELEASE_TABLET | ORAL | 2 refills | Status: DC

## 2019-03-02 MED ORDER — VITAFOL ULTRA 29-0.6-0.4-200 MG PO CAPS: 1.0000 | ORAL_CAPSULE | Freq: Every day | ORAL | 11 refills | Status: AC

## 2019-03-02 MED ORDER — PROMETHAZINE HCL 25 MG/ML IJ SOLN
25.0000 mg | Freq: Once | INTRAVENOUS | Status: AC
  Administered 2019-03-02: 25 mg via INTRAVENOUS
  Filled 2019-03-02: qty 1

## 2019-03-02 NOTE — MAU Note (Signed)
.   Becky Ray is a 39 y.o. at [redacted]w[redacted]d here in MAU reporting: Nausea and vomiting for the past week. She also reports lower abdominal cramping since yesterday that comes and goes. No VB.   Pain score: 6 Vitals:   03/02/19 0827  BP: 118/70  Pulse: 75  Resp: 17  Temp: 98.1 F (36.7 C)  SpO2: 99%

## 2019-03-02 NOTE — Discharge Instructions (Signed)
Currently you are [redacted]w[redacted]d with a due date of 10-13-19 Take Tylenol 325 mg 2 tablets by mouth every 4 hours if needed for pain. Drink at least 8 8-oz glasses of water every day. Eat small frequent amounts of food every 2-3 hours to keep some food in your stomach to relieve some of the nausea. Get your medicine from the pharmacy and take as directed. Make sure you have MyChart on your phone and there are directions on your discharge papers. Expect a call from the clinic to get you established for prenatal care.

## 2019-03-02 NOTE — MAU Provider Note (Signed)
History     CSN: [id number]  Arrival date and time: 03/02/19 9563   First Provider Initiated Contact with Patient 03/02/19 0850      Chief Complaint  Patient presents with  . Nausea  . Abdominal Pain   HPI Becky Ray 39 y.o. [redacted]w[redacted]d by uncertain LMP.  Having nausea and vomiting and lower abdominal pain.  Had a miscarriage in August 2020.  Reviewed note from MAU from 10-15-18.  Had miscarriage a couple of days later and did not go for followup.  Had 2 menstrual cycles since the miscarriage. Has not been able to eat food for about one week.  Vomited 5 times yesterday.  Has not vomited today.  Has not had anything to eat today.  Does not have a doctor other than the [facility].  Is on methadone and was able to keep it down today but was not able to keep down the methadone yesterday.  Sees [facility] for methadone dosing.  OB History    Gravida  5   Para  1   Term  1   Preterm      AB  2   Living  1     SAB  1   TAB  1   Ectopic      Multiple      Live Births  1           Past Medical History:  Diagnosis Date  . Anxiety   . Chronic back pain   . Hepatitis    Hep C -  no treatment  . Hyperlipidemia    diet control  . Insomnia   . Pneumonia 2000   hx x 1 - 20 yrs ago in 2000  . Seasonal allergies   . SVD (spontaneous vaginal delivery)    x 1    Past Surgical History:  Procedure Laterality Date  . OPEN REDUCTION INTERNAL FIXATION (ORIF) HAND Left 05/07/2018   Procedure: LEFT HAND OPEN REDUCTION INTERNAL FIXATION (ORIF) WITH REPAIR  OF 5TH METACARPAL FRACTURE;  Surgeon: Bradly Bienenstock, MD;  Location: [facility];  Service: Orthopedics;  Laterality: Left;  . WISDOM TOOTH EXTRACTION      History reviewed. No pertinent family history.  Social History   Tobacco Use  . Smoking status: Current Every Day Smoker    Packs/day: 0.25    Years: 20.00    Pack years: 5.00    Types: Cigarettes    Last  attempt to quit: 02/19/2018    Years since quitting: 1.0  . Smokeless tobacco: Never Used  . Tobacco comment: 0.5-1.0 ppd  Substance Use Topics  . Alcohol use: Not Currently    Alcohol/week: 5.0 - 6.0 standard drinks    Types: 5 - 6 Glasses of wine per week    Comment: wine  . Drug use: Not Currently    Types: IV, Heroin    Comment: heroin one week ago, has not used cocaine in over a year    Allergies: No Known Allergies  Medications Prior to Admission  Medication Sig Dispense Refill Last Dose  . buprenorphine (SUBUTEX) 8 MG SUBL SL tablet Place 8 mg under the tongue 3 (three) times daily.     . cetirizine (ZYRTEC) 10 MG tablet Take 10 mg by mouth 2 (two) times daily as needed for allergies.     Marland Kitchen gabapentin (NEURONTIN) 300 MG capsule Take 600-1,200 mg by mouth See admin instructions. Take 2 capsule (600 mg) by  mouth 3 times daily after each meal & take 4 capsules (1200 mg) by mouth at bedtime.     Knox Saliva EYE DROPS 0.4-0.3 % SOLN Place 1 drop into both eyes daily. Allergies     . Multiple Vitamin (MULTIVITAMIN WITH MINERALS) TABS tablet Take 1 tablet by mouth daily.     . traZODone (DESYREL) 100 MG tablet Take 100 mg by mouth at bedtime.     Marland Kitchen acetaminophen (TYLENOL) 500 MG tablet Take 500 mg by mouth every 6 (six) hours as needed for moderate pain.     . methadone (DOLOPHINE) 10 MG/5ML solution Take by mouth. Pt states that she get 85 mg per day liquid solution. Pt is unsure how many ml     Only medications at this time is Methadone - confirmed with client.  Review of Systems  Constitutional: Negative for fever.  Respiratory: Negative for cough and shortness of breath.   Gastrointestinal: Positive for abdominal pain, nausea and vomiting.  Genitourinary: Negative for dysuria, vaginal bleeding and vaginal discharge.   Physical Exam   Blood pressure 118/70, pulse 75, temperature 98.1 F (36.7 C), temperature source Oral, resp. rate 17, height 6' (1.829 m), weight 63 kg, last  menstrual period 12/14/2018, SpO2 99 %, unknown if currently breastfeeding.  Physical Exam  Nursing note and vitals reviewed. Constitutional: She is oriented to person, place, and time. She appears well-developed and well-nourished.  HENT:  Head: Normocephalic.  Eyes: EOM are normal.  Cardiovascular: Normal rate.  GI: Soft. There is no abdominal tenderness. There is no rebound and no guarding.  Not able to palpate fundus above the symphysis  Genitourinary:    Genitourinary Comments: Speculum exam: Vagina - Small amount of white discharge, no odor Cervix - No bleeding, unable to visualize with speculum Bimanual exam: Cervix closed Uterus non tender, very hard and retroverted, not at 11 week size Adnexa - slightly tender on right side, fullness on Right side GC/Chlam, wet prep done Chaperone present for exam.    Musculoskeletal:        General: Normal range of motion.     Cervical back: Neck supple.  Neurological: She is alert and oriented to person, place, and time.  Skin: Skin is warm and dry.  Psychiatric: She has a normal mood and affect.    MAU Course  Procedures CLINICAL DATA:  Pregnant, lower abdominal pain; LMP 12/14/2018  EXAM: OBSTETRIC <14 WK [place] AND TRANSVAGINAL OB US  TECHNIQUE: Both transabdominal and transvaginal ultrasound examinations were performed for complete evaluation of the gestation as well as the maternal uterus, adnexal regions, and pelvic cul-de-sac. Transvaginal technique was performed to assess early pregnancy.  COMPARISON:  None for this gestation  FINDINGS: Intrauterine gestational sac: Present, single  Yolk sac:  Present  Embryo:  Present  Cardiac Activity: Present  Heart Rate: 158 bpm  CRL:  15.2 mm   7 w   6 d                  [place] EDC: 10/13/2019  Subchorionic hemorrhage: Small subchronic hemorrhage present 9 x 3 x 3 mm  Maternal uterus/adnexae:  Uterus retroverted without additional mass.  RIGHT ovary measures  2.0 x 3.2 x 1.9 cm and contains a small corpus luteum.  LEFT ovary normal size and morphology, 2.3 x 2.8 x 1.3 cm.  No free pelvic fluid or adnexal masses.  IMPRESSION: Single live intrauterine gestation at 7 weeks 6 days EGA by crown-rump length.  No acute abnormalities.  LABS Results for orders placed or performed during the hospital encounter of 03/02/19 (from the past 24 hour(s))  Urinalysis, Routine w reflex microscopic     Status: Abnormal   Collection Time: 03/02/19  8:21 AM  Result Value Ref Range   Color, Urine YELLOW YELLOW   APPearance HAZY (A) CLEAR   Specific Gravity, Urine 1.019 1.005 - 1.030   pH 5.0 5.0 - 8.0   Glucose, UA NEGATIVE NEGATIVE mg/dL   Hgb urine dipstick NEGATIVE NEGATIVE   Bilirubin Urine NEGATIVE NEGATIVE   Ketones, ur NEGATIVE NEGATIVE mg/dL   Protein, ur NEGATIVE NEGATIVE mg/dL   Nitrite NEGATIVE NEGATIVE   Leukocytes,Ua TRACE (A) NEGATIVE   RBC / HPF 0-5 0 - 5 RBC/hpf   WBC, UA 6-10 0 - 5 WBC/hpf   Bacteria, UA MANY (A) NONE SEEN   Squamous Epithelial / LPF 11-20 0 - 5   Mucus PRESENT   Pregnancy, urine POC     Status: Abnormal   Collection Time: 03/02/19  8:30 AM  Result Value Ref Range   Preg Test, Ur POSITIVE (A) NEGATIVE  Wet prep, genital     Status: Abnormal   Collection Time: 03/02/19  9:00 AM   Specimen: Cervix  Result Value Ref Range   Yeast Wet Prep HPF POC NONE SEEN NONE SEEN   Trich, Wet Prep NONE SEEN NONE SEEN   Clue Cells Wet Prep HPF POC PRESENT (A) NONE SEEN   WBC, Wet Prep HPF POC MODERATE (A) NONE SEEN   Sperm NONE SEEN   CBC     Status: None   Collection Time: 03/02/19  9:20 AM  Result Value Ref Range   WBC 4.9 4.0 - 10.5 K/uL   RBC 4.47 3.87 - 5.11 MIL/uL   Hemoglobin 12.2 12.0 - 15.0 g/dL   HCT 36.4 36.0 - 46.0 %   MCV 81.4 80.0 - 100.0 fL   MCH 27.3 26.0 - 34.0 pg   MCHC 33.5 30.0 - 36.0 g/dL   RDW 13.4 11.5 - 15.5 %   Platelets 231 150 - 400 K/uL   nRBC 0.0 0.0 - 0.2 %    MDM Blood type O  posiitve Due to lower abdominal pain, will need to rule out ectopic pregnancy.  Client does not measure 11 weeks on initial exam.  IUP identified on ultrasound and client given an ultrasound picture by provider.  Desires to start prenatal care and message sent to [place] office to contact client to schedule appointment.  Due to methadone management, will schedule with MD for first appointment. Giving Phenergan 25 mg in LR 1000cc for nausea.  Assessment and Plan  [redacted]w[redacted]d gestational age living IUP Small subchorionic hemorrhage likely is the cause of lower abdominal pain. Morning sickness Methadone maintenance Current smoker  Plan Take Tylenol 325 mg 2 tablets by mouth every 4 hours if needed for pain. Drink at least 8 8-oz glasses of water every day. Eat small frequent amounts of food every 2-3 hours to keep some food in your stomach to relieve some of the nausea. Get your medicine from the pharmacy and take as directed. Make sure you have MyChart on your phone and there are directions on your discharge papers. Expect a call from the clinic to get you established for prenatal care.    Makala Fetterolf L Nayah Lukens 03/02/2019, 10:43 AM

## 2019-03-03 LAB — GC/CHLAMYDIA PROBE AMP (~~LOC~~) NOT AT ARMC
Chlamydia: NEGATIVE
Comment: NEGATIVE
Comment: NORMAL
Neisseria Gonorrhea: NEGATIVE

## 2019-03-03 LAB — RPR: RPR Ser Ql: NONREACTIVE

## 2019-03-10 ENCOUNTER — Ambulatory Visit: Payer: BLUE CROSS/BLUE SHIELD | Admitting: *Deleted

## 2019-03-10 ENCOUNTER — Telehealth: Payer: Self-pay | Admitting: Family Medicine

## 2019-03-10 ENCOUNTER — Encounter: Payer: Self-pay | Admitting: Family Medicine

## 2019-03-10 ENCOUNTER — Other Ambulatory Visit: Payer: Self-pay

## 2019-03-10 DIAGNOSIS — O0990 Supervision of high risk pregnancy, unspecified, unspecified trimester: Secondary | ICD-10-CM

## 2019-03-10 NOTE — Progress Notes (Signed)
2:30 I called Coral for her telephone visit and phone rang over 10 times with no answer and no voice mail. Unable to leave a message.  Ereka Brau,RN  2:39 I called Shelonda for her telephone visit and after phone rang many times heard a message " I am sorry your call cannot be completed at this time. Please try your call at another time. I was not able to leave a message.  I called her contact number for Muskaan Smet and he states she is not there and he has not been able to get in touch with her either since before Christmas.   Will ask registrars to try to reschedule. Romie Keeble,RN

## 2019-03-10 NOTE — Telephone Encounter (Signed)
Attempted to contact patient to get her rescheduled for her missed new ob intake appointment. No answer, number just rang for several minutes without a voicemail clicking over. No show letter mailed. Appointment on 1/21 cancelled due to not being able to reach patient to get her rescheduled for intake appointment.

## 2019-03-12 ENCOUNTER — Encounter: Payer: BLUE CROSS/BLUE SHIELD | Admitting: Family Medicine

## 2019-03-13 ENCOUNTER — Encounter (HOSPITAL_COMMUNITY): Payer: Self-pay | Admitting: Obstetrics and Gynecology

## 2019-03-13 ENCOUNTER — Inpatient Hospital Stay (HOSPITAL_COMMUNITY)
Admission: AD | Admit: 2019-03-13 | Discharge: 2019-03-13 | Disposition: A | Discharge status: Home / Self Care | Payer: BLUE CROSS/BLUE SHIELD | Attending: Obstetrics and Gynecology | Admitting: Obstetrics and Gynecology

## 2019-03-13 ENCOUNTER — Other Ambulatory Visit: Payer: Self-pay

## 2019-03-13 DIAGNOSIS — Z79899 Other long term (current) drug therapy: Secondary | ICD-10-CM | POA: Diagnosis not present

## 2019-03-13 DIAGNOSIS — O210 Mild hyperemesis gravidarum: Secondary | ICD-10-CM | POA: Diagnosis present

## 2019-03-13 DIAGNOSIS — F17210 Nicotine dependence, cigarettes, uncomplicated: Secondary | ICD-10-CM | POA: Diagnosis not present

## 2019-03-13 DIAGNOSIS — E785 Hyperlipidemia, unspecified: Secondary | ICD-10-CM | POA: Insufficient documentation

## 2019-03-13 DIAGNOSIS — O26891 Other specified pregnancy related conditions, first trimester: Secondary | ICD-10-CM | POA: Insufficient documentation

## 2019-03-13 DIAGNOSIS — O99331 Smoking (tobacco) complicating pregnancy, first trimester: Secondary | ICD-10-CM | POA: Insufficient documentation

## 2019-03-13 DIAGNOSIS — Z3A09 9 weeks gestation of pregnancy: Secondary | ICD-10-CM | POA: Insufficient documentation

## 2019-03-13 LAB — URINALYSIS, ROUTINE W REFLEX MICROSCOPIC
Bacteria, UA: NONE SEEN
Bilirubin Urine: NEGATIVE
Glucose, UA: NEGATIVE mg/dL
Hgb urine dipstick: NEGATIVE
Ketones, ur: NEGATIVE mg/dL
Nitrite: NEGATIVE
Protein, ur: NEGATIVE mg/dL
Specific Gravity, Urine: 1.019 (ref 1.005–1.030)
pH: 5 (ref 5.0–8.0)

## 2019-03-13 LAB — COMPREHENSIVE METABOLIC PANEL
ALT: 21 U/L (ref 0–44)
AST: 19 U/L (ref 15–41)
Albumin: 3.3 g/dL — ABNORMAL LOW (ref 3.5–5.0)
Alkaline Phosphatase: 65 U/L (ref 38–126)
Anion gap: 8 (ref 5–15)
BUN: 9 mg/dL (ref 6–20)
CO2: 24 mmol/L (ref 22–32)
Calcium: 9.2 mg/dL (ref 8.9–10.3)
Chloride: 104 mmol/L (ref 98–111)
Creatinine, Ser: 0.56 mg/dL (ref 0.44–1.00)
GFR calc Af Amer: >60 mL/min (ref >60)
GFR calc non Af Amer: >60 mL/min (ref >60)
Glucose, Bld: 109 mg/dL — ABNORMAL HIGH (ref 70–99)
Potassium: 3.9 mmol/L (ref 3.5–5.1)
Sodium: 136 mmol/L (ref 135–145)
Total Bilirubin: 0.5 mg/dL (ref 0.3–1.2)
Total Protein: 6.5 g/dL (ref 6.5–8.1)

## 2019-03-13 MED ORDER — ONDANSETRON 8 MG PO TBDP: 8.0000 mg | ORAL_TABLET | Freq: Three times a day (TID) | ORAL | 0 refills | Status: DC | PRN

## 2019-03-13 MED ORDER — LACTATED RINGERS IV BOLUS
1000.0000 mL | Freq: Once | INTRAVENOUS | Status: AC
  Administered 2019-03-13: 1000 mL via INTRAVENOUS

## 2019-03-13 MED ORDER — ONDANSETRON 4 MG PO TBDP
8.0000 mg | ORAL_TABLET | Freq: Once | ORAL | Status: AC
  Administered 2019-03-13: 10:00:00 8 mg via ORAL
  Filled 2019-03-13: qty 2

## 2019-03-13 NOTE — Discharge Instructions (Signed)

## 2019-03-13 NOTE — MAU Provider Note (Signed)
Patient Becky Ray is a 39 y.o. G5P1031  at [redacted]w[redacted]d here with complaints of nausea and vomiting. She has abdominal pain "once a day" and then goes away.  She denies pain with urination, SOB, fever, abnormal discharge, vaginal bleeding. She was seen in MAU on 1/11 and diagnosed with an IUP with [facility] and given anti-emetic drugs. She has not been able to get to the pharmacy as her Medicaid has not been active. History     CSN: [id number]  Arrival date and time: 03/13/19 2505   First Provider Initiated Contact with Patient 03/13/19 1020      Chief Complaint  Patient presents with  . Morning Sickness   Emesis  This is a new problem. The current episode started in the past 7 days. The problem occurs 2 to 4 times per day. The problem has been unchanged. The emesis has an appearance of bile and stomach contents. There has been no fever.    OB History    Gravida  5   Para  1   Term  1   Preterm      AB  3   Living  1     SAB  2   TAB  1   Ectopic      Multiple      Live Births  1           Past Medical History:  Diagnosis Date  . Anxiety   . Chronic back pain   . Hepatitis    Hep C -  no treatment  . Hyperlipidemia    diet control  . Insomnia   . Pneumonia 2000   hx x 1 - 20 yrs ago in 2000  . Seasonal allergies   . SVD (spontaneous vaginal delivery)    x 1    Past Surgical History:  Procedure Laterality Date  . OPEN REDUCTION INTERNAL FIXATION (ORIF) HAND Left 05/07/2018   Procedure: LEFT HAND OPEN REDUCTION INTERNAL FIXATION (ORIF) WITH REPAIR  OF 5TH METACARPAL FRACTURE;  Surgeon: Bradly Bienenstock, MD;  Location: [facility];  Service: Orthopedics;  Laterality: Left;  . WISDOM TOOTH EXTRACTION      History reviewed. No pertinent family history.  Social History   Tobacco Use  . Smoking status: Current Every Day Smoker    Packs/day: 0.25    Years: 20.00    Pack years: 5.00    Types: Cigarettes    Last attempt to quit: 02/19/2018    Years  since quitting: 1.0  . Smokeless tobacco: Never Used  . Tobacco comment: 0.5-1.0 ppd  Substance Use Topics  . Alcohol use: Not Currently    Alcohol/week: 5.0 - 6.0 standard drinks    Types: 5 - 6 Glasses of wine per week    Comment: wine  . Drug use: Not Currently    Types: IV, Heroin    Comment: heroin one week ago, has not used cocaine in over a year    Allergies: No Known Allergies  Medications Prior to Admission  Medication Sig Dispense Refill Last Dose  . acetaminophen (TYLENOL) 500 MG tablet Take 500 mg by mouth every 6 (six) hours as needed for moderate pain.   Past Month at Unknown time  . methadone (DOLOPHINE) 10 MG/5ML solution Take by mouth. Pt states that she get 85 mg per day liquid solution. Pt is unsure how many ml   03/13/2019 at Unknown time  . Doxylamine-Pyridoxine (DICLEGIS) 10-10 MG TBEC Take 2 tablets at bedtime  and one in the morning and one in the afternoon as needed for nausea. 60 tablet 2 not taking yet  . Prenat-Fe Poly-Methfol-FA-DHA (VITAFOL ULTRA) 29-0.6-0.4-200 MG CAPS Take 1 capsule by mouth daily. 30 capsule 11 not started  . promethazine (PHENERGAN) 25 MG tablet Take 1 tablet (25 mg total) by mouth every 6 (six) hours as needed for nausea or vomiting. Use 1/2 tablet for mild symptoms. 30 tablet 0 not started    Review of Systems  Constitutional: Negative.   HENT: Negative.   Respiratory: Negative.   Gastrointestinal: Positive for vomiting.  Musculoskeletal: Negative.   Neurological: Negative.   Psychiatric/Behavioral: Negative.    Physical Exam   Blood pressure 103/62, pulse 76, temperature 98.5 F (36.9 C), resp. rate 16, height 6' (1.829 m), weight 62.6 kg, last menstrual period 12/14/2018, unknown if currently breastfeeding.  Physical Exam  Constitutional: She is oriented to person, place, and time. She appears well-developed and well-nourished.  HENT:  Head: Normocephalic.  Respiratory: Effort normal.  GI: Soft.  Musculoskeletal:      Cervical back: Normal range of motion.  Neurological: She is alert and oriented to person, place, and time.  Skin: Skin is warm and dry.    MAU Course  Procedures  MDM 1 Liter of IV LR with Zofran ODT; patient tolerated PO challenge well.  -CMP normal and UA normal, no signs of dehydration.   Assessment and Plan   1. Morning sickness    2. Given RX for Zofran to add to regimen; patient will pick up her other RX tomorrow now that Medicaid is active.  3. Patient planning to call for prenatal care 4. Reviewed return precautions, patient to return to MAU if her condition worsens or changes.  [facility] 03/13/2019, 10:32 AM

## 2019-03-13 NOTE — MAU Note (Signed)
PT states nausea much better, crackers and Gianpaolo Mindel ale given to pt

## 2019-03-13 NOTE — MAU Note (Signed)
.   Becky Ray is a 39 y.o. at [redacted]w[redacted]d here in MAU reporting: that she is not able to keep anything down. States she has not started nausea medications because she wad.  waiting on her medicaid to come in and it came in today LMP: 12/14/18 Onset of complaint: ongoing Pain score: 7 Vitals:   03/13/19 1003  BP: 103/62  Pulse: 76  Resp: 16  Temp: 98.5 F (36.9 C)     Lab orders placed from triage: UA

## 2019-03-26 ENCOUNTER — Other Ambulatory Visit: Payer: Self-pay

## 2019-03-26 ENCOUNTER — Ambulatory Visit (INDEPENDENT_AMBULATORY_CARE_PROVIDER_SITE_OTHER): Payer: BLUE CROSS/BLUE SHIELD | Admitting: *Deleted

## 2019-03-26 DIAGNOSIS — Z5941 Food insecurity: Secondary | ICD-10-CM | POA: Insufficient documentation

## 2019-03-26 DIAGNOSIS — Z594 Lack of adequate food and safe drinking water: Secondary | ICD-10-CM | POA: Insufficient documentation

## 2019-03-26 DIAGNOSIS — O0990 Supervision of high risk pregnancy, unspecified, unspecified trimester: Secondary | ICD-10-CM

## 2019-03-26 DIAGNOSIS — F1120 Opioid dependence, uncomplicated: Secondary | ICD-10-CM

## 2019-03-26 DIAGNOSIS — M545 Low back pain, unspecified: Secondary | ICD-10-CM

## 2019-03-26 DIAGNOSIS — A6009 Herpesviral infection of other urogenital tract: Secondary | ICD-10-CM | POA: Insufficient documentation

## 2019-03-26 DIAGNOSIS — G8929 Other chronic pain: Secondary | ICD-10-CM | POA: Insufficient documentation

## 2019-03-26 DIAGNOSIS — O09899 Supervision of other high risk pregnancies, unspecified trimester: Secondary | ICD-10-CM | POA: Insufficient documentation

## 2019-03-26 DIAGNOSIS — O09529 Supervision of elderly multigravida, unspecified trimester: Secondary | ICD-10-CM

## 2019-03-26 DIAGNOSIS — K759 Inflammatory liver disease, unspecified: Secondary | ICD-10-CM

## 2019-03-26 MED ORDER — BLOOD PRESSURE KIT DEVI: 1.0000 | 0 refills | Status: AC | PRN

## 2019-03-26 NOTE — Patient Instructions (Addendum)
At [facility] [employer], we work as an integrated team, providing care to address both physical and emotional health. Your medical provider may refer you to see our Behavioral Health Clinician [facility]) on the same day you see your medical provider, as availability permits.  Our [facility] is available to all patients, visits generally last between 20-30 minutes, but can be longer or shorter, depending on patient need. The [facility] offers help with stress management, coping with symptoms of depression and anxiety, major life changes , sleep issues, changing risky behavior, grief and loss, life stress, working on personal life goals, and  behavioral health issues, as these all affect your overall health and wellness.  The [facility] is NOT available for the following: court-ordered evaluations, specialty assessments (custody or disability), letters to employers, or obtaining certification for an emotional support animal. The [facility] does not provide long-term therapeutic services. You have the right to refuse integrated behavioral health services, or to reschedule to see the [facility] at a later date.  Exception: If you are having thoughts of suicide, we require that you either see the [facility] for further assessment, or contract for safety with your medical provider prior to checking out.  Confidentiality exception: If it is suspected that a child or disabled adult is being abused or neglected, we are required by law to report that to either Child Protective Services or Adult Management consultant.  If you have a diagnosis of Bipolar affective disorder, Schizophrenia, or recurrent Major depressive disorder, we will recommend that you establish care with a psychiatrist, as these are lifelong, chronic conditions, and we want your overall emotional health and medications to be more closely monitored. If you anticipate needing extended maternity leave due to mental illness, it it recommended that you find a psychiatrist as soon as possible.  Neither the medical provider, nor the [facility], can recommend an extended maternity leave for mental health issues. Your medical provider or [facility] may refer you to a therapist for ongoing, traditional therapy, or to a psychiatrist, for medication management, if it would benefit your overall health. Depending on your insurance, you may have a copay to see the [facility]. If you are uninsured, it is recommended that you apply for financial assistance. (Forms may be requested at the front desk for in-person visits, via MyChart, or request a form during a virtual visit).  If you see the [facility] more than 6 times, you will have to complete a comprehensive clinical assessment interview with the [facility] to resume integrated services.  Any questions?    [facility] [employer]  Department of Social Services-[place] [employer] [address], [place], [place] [address] [phone]   or  www.https://hall.info/ **SNAP/EBT/ Other nutritional benefits  [facility] [address] [place], [place], [place] [address] [phone]  or  https://king.net/ **WIC for  women who are pregnant and postpartum, infants and children up to 29 years old  [facility] [employer] [facility] [address] [place], [place] [address] [phone]   or   www.theblessedtable.org  **Food pantry  [facility] [address] [place], [place], [place] [address] [phone]   or   https://brotherkolbes.godaddysites.com  **Emergency food and prepared meals  [address] [place] [employer] [facility] [address], [place], [place] [address] [phone]   or   www.cedargrovetop.us **Food pantry  [facility] [address] [place], [place] [address] [phone]   or   www.GolfingFamily.no **Food pantry  [facility]  Food Pantry [address]  [place], [place] [address] [phone] **Food pantry  [facility] [address], [place], [place] [address] [phone]   or   www.greensborourbanministry.org  **Food pantry and prepared meals  [facility] [address], [place], [place] [address] https://jfsgreensboro.org/  **Food pantry  [employer] [employer] [facility] [address], [place], [place] [address] [phone]   or   www.lbcnow.org  **Food pantry  [employer] [address], [place], [place] [address] [phone]   or   http://www.onestepfurther.com **Food pantry, nutrition education, gardening activities  [employer] [facility] [address], [place], [place] [address] [phone] **Food pantry  [facility] [place] [address], [place], [place] [address] [phone]) [phone]   or   www.salvationarmyofgreensboro.org **Food pantry  [facility] [place] [address], [place], [place] [address] [phone]   or   http://senior-resources-[place].org **Meals on Wheels Program  [facility] [address], , [place] [address] [phone]   or   www.stmattchurch.com  **Food pantry  [place] [employer] [facility] [address], , [place] [address] [phone]   or   vandaliapresbyterianchurch.org **Food pantry  [place] [employer]  [place] [employer] [facility] [address], [place], [place] [address] [phone]) [phone]   or   www.facebook.com/[place]ianUMC Food pantry  [employer] [facility] [address], [place], [place] [address] [phone] **Food pantry  [place] [facility]   Department of Public Health-[place] [address], [place], [place] [address] [phone]   or   www.co.[place].Berkley.us/ph/  [place] DHHS-Public Health-WIC ([place]) [address], [place], [place] [address] [phone]) [phone]   or   https://www.[place].gov/[place]/human-services/health-department **WIC for pregnant and postpartum women, infants and children up to 5 years old  [facility] [address], [place], [place] [address] [phone]) [phone] **Food pantry  [place] [employer] [facility] [address], [place], [place] [address] [phone]   or   emerywoodbaptistchurch.com *Food pantry  [employer] [facility] [address], [place], [place] [address] [phone]) [phone]   or   www.fcch[place].org **Food pantry  [facility] [address], [place], [place] [address] [phone]) [phone]   or   www.helpinghandshp.org **Food pantry  [employer] [facility] [address], [place], [place] [address] [phone]) [phone]   or   www.facebook.com/KBCI1 **Food Pantry  [employer] [facility] [address], [place], [place] [address] [phone]   or   www.abbottscreek.org **Food pantry  [employer] [facility] [facility] [place] [phone]   **Delivers meals  [facility] [address], [place], [place] [address] [phone]   or   nbfgm.sundaystreamwebsites.com  **Food pantry  [facility] [place] [address], [place], [place] [address] [phone]   or   www.odm-hp.org  **Food pantry  [facility] [employer] [facility] [address], [place], [place] [address] [phone]   or   R2live.tv **Food pantry  [facility] [address], [place], [place] [address] [phone]   or   www.salvationarmycarolinas.org/[place] **Emergency food and pet food  [facility] [address], [place],  [address] [phone]   or   www.senioradults.org **Congregate and delivered meals to older adults  [employer] [facility] [place] [address], [place],   [address] [phone]     or   www.unitedwayhp.org **Back Pack Program for elementary school students  [address] [facility] [address], [place], [place] [address] [phone]) [phone]   or   www.wardstreetcommunityresources.org **Food pantry  [facility] [address], [place], [place] [address] [phone]   or   www.ywcahp.com **Emergency food, nutrition classes, food budgeting   

## 2019-03-26 NOTE — Progress Notes (Signed)
I connected with  Becky Ray on 03/26/19 at  9:30 AM EST by telephone and verified that I am speaking with the correct person using two identifiers.   I discussed the limitations, risks, security and privacy concerns of performing an evaluation and management service by telephone and the availability of in person appointments. I also discussed with the patient that there may be a patient responsible charge related to this service. The patient expressed understanding and agreed to proceed.  Explained I am completing her New OB Intake today. We discussed Her EDD and that it is based on early [place] due to unsure LMP . I reviewed her allergies, meds, OB History, Medical /Surgical history, and appropriate screenings. She does have food insecurity and I explained I will put some resources in her AFS today and is she needs a printed copy- we can do at her in person visit. I informed her of [facility] services.  I explained I will send her the [employer] app- app sent to her while on phone- she will download later.  I explained we will send a blood pressure cuff to [facility]  And we called [facility] - We were told it may not be covered because Medicaid is her secondary insurance . [facility] will notify [name] if it is not covered. I informed her if is not covered we ask that she purchase a blood pressure cuff that can be used on her arm, not wrist.  Explained  then we will have her take her blood pressure weekly and enter into the app. Explained she will have some visits in office and some virtually. She already has MyChart  And prefers to go online but [employer]. I reviewed her new ob  appointment date/ time with her , our location and to wear mask, no visitors.  I explained she will have a pelvic exam, ob bloodwork, hemoglobin a1C, cbg , genetic testing if desired,- she does want a panorama,  pap . I scheduled an [place] at 19 weeks and gave her the appointment. I also offered her genetic counseling and she accepted.  She  voices understanding.   Zaley Talley,RN 03/26/2019  9:34 AM

## 2019-03-26 NOTE — Progress Notes (Signed)
Patient seen and assessed by nursing staff.  Agree with documentation and plan.  

## 2019-03-30 ENCOUNTER — Ambulatory Visit (INDEPENDENT_AMBULATORY_CARE_PROVIDER_SITE_OTHER): Payer: BLUE CROSS/BLUE SHIELD | Admitting: Family Medicine

## 2019-03-30 ENCOUNTER — Other Ambulatory Visit: Payer: Self-pay

## 2019-03-30 ENCOUNTER — Encounter: Payer: Self-pay | Admitting: Family Medicine

## 2019-03-30 VITALS — BP 104/70 | HR 93 | Wt 139.9 lb

## 2019-03-30 DIAGNOSIS — O99611 Diseases of the digestive system complicating pregnancy, first trimester: Secondary | ICD-10-CM

## 2019-03-30 DIAGNOSIS — A6009 Herpesviral infection of other urogenital tract: Secondary | ICD-10-CM

## 2019-03-30 DIAGNOSIS — O99321 Drug use complicating pregnancy, first trimester: Secondary | ICD-10-CM | POA: Diagnosis not present

## 2019-03-30 DIAGNOSIS — Z124 Encounter for screening for malignant neoplasm of cervix: Secondary | ICD-10-CM

## 2019-03-30 DIAGNOSIS — O98511 Other viral diseases complicating pregnancy, first trimester: Secondary | ICD-10-CM | POA: Diagnosis not present

## 2019-03-30 DIAGNOSIS — Z23 Encounter for immunization: Secondary | ICD-10-CM

## 2019-03-30 DIAGNOSIS — F1120 Opioid dependence, uncomplicated: Secondary | ICD-10-CM

## 2019-03-30 DIAGNOSIS — Z113 Encounter for screening for infections with a predominantly sexual mode of transmission: Secondary | ICD-10-CM | POA: Diagnosis not present

## 2019-03-30 DIAGNOSIS — Z1151 Encounter for screening for human papillomavirus (HPV): Secondary | ICD-10-CM

## 2019-03-30 DIAGNOSIS — O99341 Other mental disorders complicating pregnancy, first trimester: Secondary | ICD-10-CM | POA: Diagnosis not present

## 2019-03-30 DIAGNOSIS — K759 Inflammatory liver disease, unspecified: Secondary | ICD-10-CM

## 2019-03-30 DIAGNOSIS — O09521 Supervision of elderly multigravida, first trimester: Secondary | ICD-10-CM | POA: Diagnosis not present

## 2019-03-30 DIAGNOSIS — Z3A11 11 weeks gestation of pregnancy: Secondary | ICD-10-CM

## 2019-03-30 DIAGNOSIS — O0990 Supervision of high risk pregnancy, unspecified, unspecified trimester: Secondary | ICD-10-CM

## 2019-03-30 DIAGNOSIS — K5903 Drug induced constipation: Secondary | ICD-10-CM

## 2019-03-30 MED ORDER — DOCUSATE SODIUM 100 MG PO CAPS: 100.0000 mg | ORAL_CAPSULE | Freq: Two times a day (BID) | ORAL | 2 refills | Status: AC | PRN

## 2019-03-30 NOTE — Patient Instructions (Signed)

## 2019-03-30 NOTE — Progress Notes (Signed)
NEW OB packet given  Home Medicaid Form Completed

## 2019-03-30 NOTE — Progress Notes (Signed)
Subjective:   Becky Ray is a 39 y.o. G5P1031 at 38w6dby early ultrasound being seen today for her first obstetrical visit.  Her obstetrical history is significant for advanced maternal age, smoker and methadone. Pregnancy history fully reviewed.  Patient reports no complaints.  HISTORY: OB History  Gravida Para Term Preterm AB Living  5 1 1  0 3 1  SAB TAB Ectopic Multiple Live Births  2 1 0 0 1    # Outcome Date GA Lbr Len/2nd Weight Sex Delivery Anes PTL Lv  5 Current           4 SAB 09/2018 [id number]       3 SAB 09/2016 [id number]      2 Term 2010   6 lb 13 oz (3.09 kg) F Vag-Spont EPI  LIV     Birth Comments: wnl  1 TAB 2004           Past Medical History:  Diagnosis Date  . Anxiety   . Chronic back pain   . Hepatitis    Hep C -  no treatment  . Herpes genitalis in women   . Hyperlipidemia    diet control  . Insomnia   . Pneumonia 2000   hx x 1 - 20 yrs ago in 2000  . Seasonal allergies   . SVD (spontaneous vaginal delivery)    x 1   Past Surgical History:  Procedure Laterality Date  . OPEN REDUCTION INTERNAL FIXATION (ORIF) HAND Left 05/07/2018   Procedure: LEFT HAND OPEN REDUCTION INTERNAL FIXATION (ORIF) WITH REPAIR  OF 5TH METACARPAL FRACTURE;  Surgeon: OrtIran PlanasD;  [facility];  Laterality: Left;  . WISDOM TOOTH EXTRACTION     Family History  Problem Relation Age of Onset  . Breast cancer Mother 50 56 Hyperlipidemia Father    Social History   Tobacco Use  . Smoking status: Current Every Day Smoker    Packs/day: 0.25    Years: 20.00    Pack years: 5.00    Types: Cigarettes    Last attempt to quit: 02/19/2018    Years since quitting: 1.1  . Smokeless tobacco: Never Used  . Tobacco comment: 0.5-1.0 ppd  Substance Use Topics  . Alcohol use: Not Currently    Alcohol/week: 5.0 - 6.0 standard drinks    Types: 5 - 6 Glasses of wine per week    Comment: wine  . Drug use: Not Currently    Types:  IV, Heroin    Comment: heroin one week ago, has not used cocaine in over a year   No Known Allergies Current Outpatient Medications on File Prior to Visit  Medication Sig Dispense Refill  . acetaminophen (TYLENOL) 500 MG tablet Take 500 mg by mouth every 6 (six) hours as needed for moderate pain.    . Doxylamine-Pyridoxine (DICLEGIS) 10-10 MG TBEC Take 2 tablets at bedtime and one in the morning and one in the afternoon as needed for nausea. 60 tablet 2  . methadone (DOLOPHINE) 10 MG/5ML solution Take 130 mg by mouth daily. Pt states that she get 130 mg per day liquid solution. Pt is unsure how many ml    . ondansetron (ZOFRAN ODT) 8 MG disintegrating tablet Take 1 tablet (8 mg total) by mouth every 8 (eight) hours as needed for nausea or vomiting. 20 tablet 0  . Prenat-Fe Poly-Methfol-FA-DHA (VITAFOL ULTRA) 29-0.6-0.4-200 MG CAPS Take 1  capsule by mouth daily. 30 capsule 11  . promethazine (PHENERGAN) 25 MG tablet Take 1 tablet (25 mg total) by mouth every 6 (six) hours as needed for nausea or vomiting. Use 1/2 tablet for mild symptoms. 30 tablet 0  . Blood Pressure Monitoring (BLOOD PRESSURE KIT) DEVI 1 Device by Does not apply route as needed. (Patient not taking: Reported on 03/30/2019) 1 each 0   No current facility-administered medications on file prior to visit.     Exam   Vitals:   03/30/19 1452  BP: 104/70  Pulse: 93  Weight: 139 lb 14.4 oz (63.5 kg)   Fetal Heart Rate (bpm): 163  Uterus:   12 wk u/s  Pelvic Exam: Perineum: no hemorrhoids, normal perineum   Vulva: normal external genitalia, no lesions   Vagina:  normal mucosa, normal discharge   Cervix: no lesions and normal, pap smear done.    Adnexa: normal adnexa and no mass, fullness, tenderness   Bony Pelvis: average  System: General: well-developed, well-nourished female in no acute distress   Breast:  normal appearance, no masses or tenderness   Skin: normal coloration and turgor, no rashes   Neurologic: oriented,  normal, negative, normal mood   Extremities: normal strength, tone, and muscle mass, ROM of all joints is normal   HEENT PERRLA, extraocular movement intact and sclera clear, anicteric   Mouth/Teeth mucous membranes moist, pharynx normal without lesions and dental hygiene good   Neck supple and no masses   Cardiovascular: regular rate and rhythm   Respiratory:  no respiratory distress, normal breath sounds   Abdomen: soft, non-tender; bowel sounds normal; no masses,  no organomegaly     Assessment:   Pregnancy: [id number] Patient Active Problem List   Diagnosis Date Noted  . Hepatitis   . Chronic back pain   . AMA (advanced maternal age) multigravida 59+   . Herpes genitalis in women   . Food insecurity   . Short interval between pregnancies affecting pregnancy, antepartum   . Supervision of high risk pregnancy, antepartum 03/10/2019  . 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 Morning sickness 03/02/2019  . Abdominal pain affecting pregnancy 10/01/2018  . 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 Supervision of high risk pregnancy, antepartum New OB labs - Cytology - PAP( [place]) - Genetic Screening - Obstetric Panel, Including HIV - Culture, OB Urine - Hemoglobin A1c  2. Herpes genitalis in women Will need suppression at 36 wks  3. Multigravida of advanced maternal age in first trimester NIPT  4. Methadone maintenance therapy patient [facility]) [facility] referral Clean x 6 months on Methadone  5. Hepatitis Will refer for treatment post delivery - HCV RNA quant - Comprehensive metabolic panel  6. Drug-induced constipation Increase fiber and water - docusate sodium (COLACE) 100 MG capsule; Take 1 capsule (100 mg total) by mouth 2 (two) times daily as needed.  Dispense: 30 capsule; Refill: 2   Initial labs drawn. Continue prenatal vitamins. Genetic Screening discussed, NIPS: ordered. Ultrasound discussed; fetal anatomic survey:  requested. Problem list reviewed and updated. The nature of [place] with multiple MDs and other Advanced Practice Providers was explained to patient; also emphasized that residents, students are part of our team. Routine obstetric precautions reviewed. Return in 4 weeks (on 04/27/2019).

## 2019-03-31 ENCOUNTER — Encounter: Payer: Self-pay | Admitting: *Deleted

## 2019-03-31 ENCOUNTER — Encounter: Payer: Self-pay | Admitting: General Practice

## 2019-03-31 LAB — OBSTETRIC PANEL, INCLUDING HIV
Antibody Screen: NEGATIVE
Basophils Absolute: 0 10*3/uL (ref 0.0–0.2)
Basos: 0 %
EOS (ABSOLUTE): 0.1 10*3/uL (ref 0.0–0.4)
Eos: 2 %
HIV Screen 4th Generation wRfx: NONREACTIVE
Hematocrit: 37.5 % (ref 34.0–46.6)
Hemoglobin: 13.3 g/dL (ref 11.1–15.9)
Hepatitis B Surface Ag: NEGATIVE
Immature Grans (Abs): 0 10*3/uL (ref 0.0–0.1)
Immature Granulocytes: 0 %
Lymphocytes Absolute: 1.3 10*3/uL (ref 0.7–3.1)
Lymphs: 19 %
MCH: 28.2 pg (ref 26.6–33.0)
MCHC: 35.5 g/dL (ref 31.5–35.7)
MCV: 79 fL (ref 79–97)
Monocytes Absolute: 0.6 10*3/uL (ref 0.1–0.9)
Monocytes: 9 %
Neutrophils Absolute: 4.7 10*3/uL (ref 1.4–7.0)
Neutrophils: 70 %
Platelets: 182 10*3/uL (ref 150–450)
RBC: 4.72 x10E6/uL (ref 3.77–5.28)
RDW: 14.2 % (ref 11.7–15.4)
RPR Ser Ql: NONREACTIVE
Rh Factor: POSITIVE
Rubella Antibodies, IGG: 19.8 index (ref >0.99)
WBC: 6.6 10*3/uL (ref 3.4–10.8)

## 2019-04-01 LAB — CYTOLOGY - PAP
Chlamydia: NEGATIVE
Comment: NEGATIVE
Comment: NEGATIVE
Comment: NORMAL
Diagnosis: NEGATIVE
High risk HPV: NEGATIVE
Neisseria Gonorrhea: NEGATIVE

## 2019-04-02 LAB — COMPREHENSIVE METABOLIC PANEL
ALT: 27 IU/L (ref 0–32)
AST: 26 IU/L (ref 0–40)
Albumin/Globulin Ratio: 1.2 (ref 1.2–2.2)
Albumin: 4.2 g/dL (ref 3.8–4.8)
Alkaline Phosphatase: 94 IU/L (ref 39–117)
BUN/Creatinine Ratio: 23 (ref 9–23)
BUN: 11 mg/dL (ref 6–20)
Bilirubin Total: 0.3 mg/dL (ref 0.0–1.2)
CO2: 23 mmol/L (ref 20–29)
Calcium: 9.4 mg/dL (ref 8.7–10.2)
Chloride: 98 mmol/L (ref 96–106)
Creatinine, Ser: 0.48 mg/dL — ABNORMAL LOW (ref 0.57–1.00)
GFR calc Af Amer: 146 mL/min/{1.73_m2} (ref >59)
GFR calc non Af Amer: 127 mL/min/{1.73_m2} (ref >59)
Globulin, Total: 3.5 g/dL (ref 1.5–4.5)
Glucose: 56 mg/dL — ABNORMAL LOW (ref 65–99)
Potassium: 3.7 mmol/L (ref 3.5–5.2)
Sodium: 136 mmol/L (ref 134–144)
Total Protein: 7.7 g/dL (ref 6.0–8.5)

## 2019-04-02 LAB — HCV RNA QUANT: Hepatitis C Quantitation: NOT DETECTED IU/mL

## 2019-04-02 LAB — URINE CULTURE, OB REFLEX

## 2019-04-02 LAB — CULTURE, OB URINE

## 2019-04-06 ENCOUNTER — Telehealth: Payer: Self-pay | Admitting: Clinical

## 2019-04-06 NOTE — Telephone Encounter (Signed)
Call to pt to confirm referral to [facility]. Pt agrees to call [facility], and agrees to a follow up call from [facility] at [place] on Tuesday, February 23rd.   [facility] [phone] [email or web address]

## 2019-04-14 ENCOUNTER — Telehealth: Payer: Self-pay | Admitting: Clinical

## 2019-04-14 NOTE — Telephone Encounter (Signed)
Left HIPPA-compliant message to call back Corie Allis from [facility] at [phone].   

## 2019-05-01 ENCOUNTER — Encounter: Payer: BLUE CROSS/BLUE SHIELD | Admitting: Obstetrics and Gynecology

## 2019-05-04 ENCOUNTER — Ambulatory Visit (INDEPENDENT_AMBULATORY_CARE_PROVIDER_SITE_OTHER): Payer: BLUE CROSS/BLUE SHIELD | Admitting: Family Medicine

## 2019-05-04 ENCOUNTER — Other Ambulatory Visit: Payer: Self-pay

## 2019-05-04 VITALS — BP 113/70 | HR 73 | Wt 147.0 lb

## 2019-05-04 DIAGNOSIS — K759 Inflammatory liver disease, unspecified: Secondary | ICD-10-CM

## 2019-05-04 DIAGNOSIS — O0990 Supervision of high risk pregnancy, unspecified, unspecified trimester: Secondary | ICD-10-CM

## 2019-05-04 DIAGNOSIS — F1120 Opioid dependence, uncomplicated: Secondary | ICD-10-CM

## 2019-05-04 DIAGNOSIS — O09521 Supervision of elderly multigravida, first trimester: Secondary | ICD-10-CM

## 2019-05-04 DIAGNOSIS — Z3A16 16 weeks gestation of pregnancy: Secondary | ICD-10-CM

## 2019-05-04 DIAGNOSIS — O09522 Supervision of elderly multigravida, second trimester: Secondary | ICD-10-CM

## 2019-05-04 DIAGNOSIS — O99322 Drug use complicating pregnancy, second trimester: Secondary | ICD-10-CM

## 2019-05-04 DIAGNOSIS — A6009 Herpesviral infection of other urogenital tract: Secondary | ICD-10-CM

## 2019-05-04 MED ORDER — PROMETHAZINE HCL 25 MG PO TABS: 25.0000 mg | ORAL_TABLET | Freq: Four times a day (QID) | ORAL | 1 refills | Status: AC | PRN

## 2019-05-04 NOTE — Progress Notes (Signed)
   PRENATAL VISIT NOTE  Subjective:  Becky Ray is a 39 y.o. G5P1031 at [redacted]w[redacted]d being seen today for ongoing prenatal care.  She is currently monitored for the following issues for this high-risk pregnancy and has Abdominal pain affecting pregnancy; Methadone maintenance therapy patient (HCC); Morning sickness; Supervision of high risk pregnancy, antepartum; Hepatitis; Chronic back pain; AMA (advanced maternal age) multigravida 35+; Herpes genitalis in women; Food insecurity; Short interval between pregnancies affecting pregnancy, antepartum; and Substance abuse in remission [facility]) on their problem list.  Patient reports occasional nausea. Having some pelvic cramping in the mornings - usually after getting home from getting her methadone (rides the bus for an hour, walks 1.5 miles, has to urinate when gets home).  Contractions: Not present. Vag. Bleeding: None.  Movement: Absent. Denies leaking of fluid.   The following portions of the patient's history were reviewed and updated as appropriate: allergies, current medications, past family history, past medical history, past social history, past surgical history and problem list.   Objective:   Vitals:   05/04/19 1025  BP: 113/70  Pulse: 73  Weight: 147 lb (66.7 kg)    Fetal Status: Fetal Heart Rate (bpm): 140   Movement: Absent     General:  Alert, oriented and cooperative. Patient is in no acute distress.  Skin: Skin is warm and dry. No rash noted.   Cardiovascular: Normal heart rate noted  Respiratory: Normal respiratory effort, no problems with respiration noted  Abdomen: Soft, gravid, appropriate for gestational age.  Pain/Pressure: Present     Pelvic: Cervical exam deferred        Extremities: Normal range of motion.  Edema: None  Mental Status: Normal mood and affect. Normal behavior. Normal judgment and thought content.   Assessment and Plan:  Pregnancy: G5P1031 at [redacted]w[redacted]d 1. Supervision of high risk pregnancy, antepartum Will watch  cramping.  2. Methadone maintenance therapy patient (HCC) stable  3. Multigravida of advanced maternal age in first trimester NIPs normal  4. Herpes genitalis in women 35 weeks  5. Hepatitis Needs referral to ID after delivery  Preterm labor symptoms and general obstetric precautions including but not limited to vaginal bleeding, contractions, leaking of fluid and fetal movement were reviewed in detail with the patient. Please refer to After Visit Summary for other counseling recommendations.   Return in about 4 weeks (around 06/01/2019) for OB f/u.  Future Appointments  Date Time Provider Department Center  05/19/2019  9:00 AM WH-MFC NURSE WH-MFC MFC-US  05/19/2019  9:00 AM WH-MFC [place] 3 WH-MFCUS MFC-US  05/19/2019 10:00 AM WH-MFC GENETIC COUNSELING RM WH-MFC MFC-US    Levie Heritage, DO

## 2019-05-19 ENCOUNTER — Other Ambulatory Visit: Payer: Self-pay

## 2019-05-19 ENCOUNTER — Ambulatory Visit (HOSPITAL_COMMUNITY)
Admission: RE | Admit: 2019-05-19 | Discharge: 2019-05-19 | Disposition: A | Discharge status: Home / Self Care | Payer: BLUE CROSS/BLUE SHIELD | Source: Ambulatory Visit | Attending: Obstetrics and Gynecology | Admitting: Obstetrics and Gynecology

## 2019-05-19 ENCOUNTER — Ambulatory Visit (HOSPITAL_COMMUNITY): Payer: Self-pay | Admitting: Family Medicine

## 2019-05-19 ENCOUNTER — Encounter (HOSPITAL_COMMUNITY): Payer: Self-pay

## 2019-05-19 ENCOUNTER — Ambulatory Visit (HOSPITAL_COMMUNITY): Payer: BLUE CROSS/BLUE SHIELD | Admitting: *Deleted

## 2019-05-19 ENCOUNTER — Ambulatory Visit (HOSPITAL_BASED_OUTPATIENT_CLINIC_OR_DEPARTMENT_OTHER): Payer: BLUE CROSS/BLUE SHIELD | Admitting: Genetic Counselor

## 2019-05-19 ENCOUNTER — Other Ambulatory Visit (HOSPITAL_COMMUNITY): Payer: Self-pay | Admitting: *Deleted

## 2019-05-19 DIAGNOSIS — G8929 Other chronic pain: Secondary | ICD-10-CM | POA: Insufficient documentation

## 2019-05-19 DIAGNOSIS — O09522 Supervision of elderly multigravida, second trimester: Secondary | ICD-10-CM

## 2019-05-19 DIAGNOSIS — O99320 Drug use complicating pregnancy, unspecified trimester: Secondary | ICD-10-CM

## 2019-05-19 DIAGNOSIS — F1120 Opioid dependence, uncomplicated: Secondary | ICD-10-CM | POA: Insufficient documentation

## 2019-05-19 DIAGNOSIS — K759 Inflammatory liver disease, unspecified: Secondary | ICD-10-CM | POA: Diagnosis present

## 2019-05-19 DIAGNOSIS — Z315 Encounter for genetic counseling: Secondary | ICD-10-CM

## 2019-05-19 DIAGNOSIS — O09899 Supervision of other high risk pregnancies, unspecified trimester: Secondary | ICD-10-CM | POA: Diagnosis present

## 2019-05-19 DIAGNOSIS — M545 Low back pain: Secondary | ICD-10-CM | POA: Insufficient documentation

## 2019-05-19 DIAGNOSIS — Z5941 Food insecurity: Secondary | ICD-10-CM

## 2019-05-19 DIAGNOSIS — O283 Abnormal ultrasonic finding on antenatal screening of mother: Secondary | ICD-10-CM

## 2019-05-19 DIAGNOSIS — O99322 Drug use complicating pregnancy, second trimester: Secondary | ICD-10-CM

## 2019-05-19 DIAGNOSIS — Z3A19 19 weeks gestation of pregnancy: Secondary | ICD-10-CM

## 2019-05-19 DIAGNOSIS — A6009 Herpesviral infection of other urogenital tract: Secondary | ICD-10-CM | POA: Insufficient documentation

## 2019-05-19 DIAGNOSIS — Z594 Lack of adequate food and safe drinking water: Secondary | ICD-10-CM | POA: Diagnosis present

## 2019-05-19 DIAGNOSIS — O0990 Supervision of high risk pregnancy, unspecified, unspecified trimester: Secondary | ICD-10-CM

## 2019-05-19 DIAGNOSIS — Z363 Encounter for antenatal screening for malformations: Secondary | ICD-10-CM | POA: Diagnosis not present

## 2019-05-19 DIAGNOSIS — F1920 Other psychoactive substance dependence, uncomplicated: Secondary | ICD-10-CM | POA: Diagnosis not present

## 2019-05-19 NOTE — Progress Notes (Signed)
05/19/2019  Becky Ray [birth year] MRN: [id number] DOV: 05/19/2019  Becky Ray presented to the [facility] for a genetics consultation regarding advanced maternal age. Becky Ray came to her appointment alone due to COVID-19 visitor restrictions.   Indication for genetic counseling - Advanced maternal age  Prenatal history  Becky Ray is a [id number], 39 y.o. female. Her current pregnancy has completed [redacted]w[redacted]d (Estimated Date of Delivery: 10/13/19).  Becky Ray denied exposure to environmental toxins or chemical agents. She denied the use of alcohol. She reported smoking half of a pack of cigarettes per day. She also reported using heroin and fentanyl during this pregnancy, with the most recent usage occurring last week. She reported taking Methadone, Phenergan, Zofran, Tylenol, Trazodone, Seroquel, and Xanax. She denied significant viral illnesses, fevers, and bleeding during the course of her pregnancy. Her medical history was otherwise noncontributory.  Prenatal exposure to tobacco can increase the risk for placenta previa and placental abruption, preterm birth, low birth weight, oral clefts, stillbirth, and sudden infant death syndrome (SIDS). Prenatal exposure to nicotine is also associated with an increased likelihood of neurological disorder and asthma. The risk of pregnancy complications associated with cigarette exposure tends to increase with the amount of cigarettes a woman smokes. It is never too late to quit smoking, and Becky Ray is encouraged to reduce the number of cigarettes smoked per day with the goal to eventually quit smoking altogether.  Prenatal exposure to heroin and other opioids can increase the risk for pregnancy complications such as fetal growth restriction, stillbirth, preterm delivery, and fetal distress during labor. Use of an opioid close to the time of delivery can result in withdrawal symptoms and neonatal abstinence syndrome in a baby. While studies  are conflicting regarding the association between opioids and learning disabilities in children, some studies have identified problems with learning and behavior in children exposed to opioids for a long period of time during pregnancy. Becky Ray is currently on Methadone maintenance and is encouraged to contact her healthcare provider for assistance in discontinuing opioid use.  Family History  A three generation pedigree was drafted and reviewed. The family history is remarkable for the following:  - Becky Ray reported a personal history of ADHD and a learning disability, specifically in math. We discussed that many times, learning disabilities are multifactorial in nature, occurring due to a combination of genetic and environmental factors that are difficult to identify. Learning disabilities can appear to run in families; thus, there is a chance that Becky Ray's children could also experience learning disabilities of some kind. She understands that she should make the pediatrician aware of any concerns she has about her children's development.   - Becky Ray has a daughter from a prior relationship who has rheumatoid arthritis. We discussed that this is one condition in the family of conditions known as autoimmune conditions. Autoimmune conditions occur when an individual's body launches an abnormal immune response and begins to destroy its own cells. There are several different autoimmune conditions. While we do know that autoimmune conditions tend to "cluster" within a family, they do not follow a clear pattern of inheritance. When there is a person in the family with an autoimmune condition, there is an increased chance for others in the family to develop an autoimmune condition, but it may be a different condition. Specific risk factor information is not available. Genetic testing is not available at this time to predict who may develop an autoimmune condition.  - Ms.  Ray's mother had breast cancer two  times, with her first diagnosis occurring in her early 22s and her second diagnosis occurring in her late 27s. Though most cancers are thought to be sporadic or due to environmental factors, some families appear to have a strong predisposition to cancers. When considering a family history of cancer, we look for common types of cancer in multiple family members occurring at younger than typical ages. We discussed the option of meeting with a cancer genetic counselor to discuss any possible screening or testing options available. If Becky Ray is concerned about the family history of cancer and would like to learn more about the family's chance for an inherited cancer syndrome, she or her healthcare provider may refer Becky Ray or her relatives to the [facility] [phone]).   - Becky Ray's partner has a daughter from a prior relationship who was born without a leg. This child reportedly was born with "her foot where her femur should be" and had the remaining leg structure amputated at age 41. There was reportedly maternal drug use during the pregnancy with this child, including cocaine. We discussed that prenatal cocaine use has been associated with several birth defects, including abnormalities of the limbs. Thus, it is possible that this child's leg anomaly may be attributed to prenatal cocaine exposure, and risk for a birth defect in the current fetus is likely not greatly elevated over the 3-5% general population risk.  The remaining family histories were reviewed and found to be noncontributory for birth defects, intellectual disability, recurrent pregnancy loss, and known genetic conditions.    The patient's ethnicity is European. The father of the pregnancy's ethnicity is African American. Ashkenazi Jewish ancestry and consanguinity were denied. Pedigree will be scanned under Media.  Discussion  Becky Ray referred to genetic counseling for advanced maternal age, as she will be67 years old  at the time of delivery. We discussed that as a woman ages, the chance for certain chromosomal conditions, such astrisomy 28 (Down syndrome), trisomy 92, and trisomy 26 in a fetus increases. These conditions often are not inherited, but insteadoccur due toan error in chromosomal division during the formation of sperm and eggcells in a process called nondisjunction. Nondisjunction occurs more frequently as a woman ages.At Becky Ray's age and during thesecond trimester, she has approximately a 1 in 78 (1.1%) chance of having a child with a chromosomal aneuploidy. Her age-related risk to have a child with Down syndrome specifically is 1 in178(0.6%)in thefirsttrimester. We briefly reviewed features associated with Down syndrome, trisomy 64, and trisomy 63.  Becky Ray had Panorama NIPS through the laboratory [facility] that was low-risk for fetal aneuploidies. We reviewed that these results showed a less than 1 in 10,000 risk for trisomies 21, 18 and 13, and monosomy X (Turner syndrome) in the current pregnancy, which is significantly decreased from her age-related risk. In addition, the risk for triploidy and sex chromosome trisomies (47,XXX and 47,XXY) was low. Ms. Walthers elected to have cfDNA analysis for 22q11.2 deletion syndrome, which was also low risk (1 in 2900). We reviewed that while this testing identifies 94-99% of pregnancies with trisomy 40, trisomy 34, trisomy 78, sex chromosome aneuploidies, and triploidy, it is NOT diagnostic. A positive test result requires confirmation by [facility] or amniocentesis, and a negative test result does not rule out a fetal chromosome abnormality. She also understands that this testing does not identify all genetic conditions.  A complete ultrasound was performed today prior to our visit. The ultrasound report  will be sent under separate cover. An echogenic intracardiac focus (EIF) was identified on ultrasound. This finding is considered to be a normal variant in the  context of low risk NIPS results.There were no other visualized fetal anomalies or markers suggestive of aneuploidy. Ms. Wesenberg understands that screening tests, including ultrasound, cannot rule out all birth defects or genetic syndromes.  Ms. Pixley also had Horizon carrier screening performed through [facility], which was negative for 14 conditions. Thus, her risk to be a carrier for these 14 conditions (listed separately in the laboratory report) has been reduced but not eliminated. This puts her pregnancies at significantly decreased risk of being affected by one of these conditions.  Ms. Zachman was also counseled regarding diagnostic testing via amniocentesis. We discussed the technical aspects of the procedure and quoted up to a 1 in 500 (0.2%) risk for spontaneous pregnancy loss or other adverse pregnancy outcomes as a result of amniocentesis. Cultured cells from an amniocentesis sample allow for the visualization of a fetal karyotype, which can detect >99% of chromosomal aberrations. Chromosomal microarray can also be performed to identify smaller deletions or duplications of fetal chromosomal material.   Ms. Done was potentially interested in pursuing an amniocentesis given the EIF was identified on ultrasound. We reviewed that while EIF can be associated with trisomy 21, the risk for trisomy 21 in the current fetus was decreased to less than 1 in 10,000 based on her Panorama NIPS result. The false negative rate for Panorama NIPS is reported to be 0.6%. Additionally, EIF can be identified in babies without trisomy 3. However, Ms. Swader was still feeling a bit nervous about this ultrasound finding and wanted to discuss the option of amniocentesis with her partner before making a decision about whether or not to undergo testing. She was also counseled that once a pregnancy has completed 24 weeks' gestation, the risk for miscarriage evolves into the risk for preterm delivery, as babies are considered viable at  that point in pregnancy. Some individualswho are interested in pursuing amniocentesis for confirmation of a fetus's chromosomal make-up but are nervous about the risk for miscarriage maychoose to delay pursuing amniocentesis until later in pregnancy.  After careful consideration, Ms. Ruble declined amniocentesis today. She understands that amniocentesis is available at any point after 16 weeks of pregnancy and that she may opt to undergo the procedure at a later date should she change her mind. She will contact us if she is interested in pursuing an amniocentesis following her discussion with her partner.  Lastly, screening for open neural tube defects (ONTDs) via MS-AFP in the second trimester in addition to level II ultrasound examination is recommended. Ms. Gritton level II ultrasound did not detect any ONTDs. Level II ultrasound is able to detect ONTDs with 90-95% sensitivity. However, normal results from level II ultrasound and MS-AFP screening do not guarantee a normal baby, as 3-5% of newborns have some type of birth defect, many of which are not prenatally diagnosable.  I counseled Ms. Groft regarding the above risks and available options. The approximate face-to-face time with the genetic counselor was 30 minutes.  In summary:  Discussed advanced maternal age and reviewed results of aneuploidy screening  ~1% risk for chromosomal aneuploidy based on age at delivery  Significant reduction in risk for Down syndrome,trisomy 18,trisomy 77, sex chromosome aneuploidies, and 22q11.2deletionsyndrome based on low-risk NIPS result  Reviewed results of ultrasound  Echogenic intracardiac focus (EIF) identified  EIF can be associated with trisomy 21, but is considered to  be normal variant in context of low-risk NIPS  Discussed negative carrier screening results  Offered additional testing and screening  Potentially interested in undergoing amniocentesis given EIF identified on ultrasound. Ms.  Coppess will contact us if interested in pursuing amniocentesis after discussing option with partner  Recommend MS-AFP screening  Reviewed family history concerns  Discussed potential effects of prenatal exposure to cigarettes and opioids   Gershon Crane, MS, [facility] Genetic Counselor

## 2019-06-01 ENCOUNTER — Telehealth: Payer: Self-pay | Admitting: Obstetrics and Gynecology

## 2019-06-01 ENCOUNTER — Encounter: Payer: Self-pay | Admitting: Obstetrics and Gynecology

## 2019-06-01 ENCOUNTER — Encounter: Payer: BLUE CROSS/BLUE SHIELD | Admitting: Obstetrics and Gynecology

## 2019-06-01 NOTE — Progress Notes (Signed)
Patient did not keep her OB appointment for 06/01/2019.  Cornelia Copa MD Attending Center for [employer] Midwife)

## 2019-06-01 NOTE — Telephone Encounter (Signed)
Attempted to contact patient in regards to her missed OB appointment and to get her rescheduled. No answer, left voicemail with new appointment information 4/19 @ 3:15. Patient instructed to give the office a call if she is needing to reschedule this appointment.

## 2019-06-08 ENCOUNTER — Telehealth: Payer: Self-pay | Admitting: Family Medicine

## 2019-06-08 ENCOUNTER — Encounter: Payer: BLUE CROSS/BLUE SHIELD | Admitting: Family Medicine

## 2019-06-08 NOTE — Telephone Encounter (Signed)
Attempted to contact patient to get her rescheduled for her missed prenatal appointment. No answer, left voicemail for patient to give the office a call back to be rescheduled. mychart message sent

## 2019-06-16 ENCOUNTER — Ambulatory Visit (HOSPITAL_COMMUNITY): Payer: BLUE CROSS/BLUE SHIELD | Attending: Obstetrics and Gynecology

## 2019-06-16 ENCOUNTER — Ambulatory Visit (HOSPITAL_COMMUNITY): Admission: RE | Admit: 2019-06-16 | Payer: BLUE CROSS/BLUE SHIELD | Source: Ambulatory Visit

## 2019-06-16 ENCOUNTER — Encounter (HOSPITAL_COMMUNITY): Payer: Self-pay

## 2019-06-22 ENCOUNTER — Encounter (INDEPENDENT_AMBULATORY_CARE_PROVIDER_SITE_OTHER): Payer: Self-pay

## 2019-07-22 ENCOUNTER — Ambulatory Visit (HOSPITAL_COMMUNITY): Payer: BLUE CROSS/BLUE SHIELD

## 2019-07-22 ENCOUNTER — Ambulatory Visit: Payer: BLUE CROSS/BLUE SHIELD

## 2019-08-17 ENCOUNTER — Ambulatory Visit: Payer: BLUE CROSS/BLUE SHIELD

## 2019-08-17 ENCOUNTER — Ambulatory Visit: Payer: Medicaid Other

## 2019-10-28 ENCOUNTER — Telehealth: Payer: Self-pay | Admitting: Pediatrics

## 2021-06-19 IMAGING — US OBSTETRIC <14 WK US AND TRANSVAGINAL OB US
1 series · 15 of 28 positions shown · non-contrast
Comparison: None.

CLINICAL DATA: Cramping

EXAM:
OBSTETRIC <14 WK US AND TRANSVAGINAL OB US
TECHNIQUE: Both transabdominal and transvaginal ultrasound examinations were
performed for complete evaluation of the gestation as well as the
maternal uterus, adnexal regions, and pelvic cul-de-sac.
Transvaginal technique was performed to assess early pregnancy.

[Series 1: obstetric <14 wk us and transvaginal ob us · 15 of 47 slices shown]
[im 1/47]
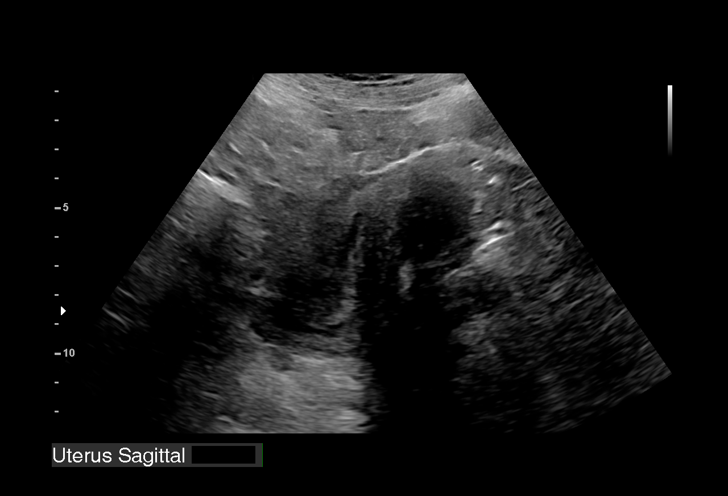
[im 4/47]
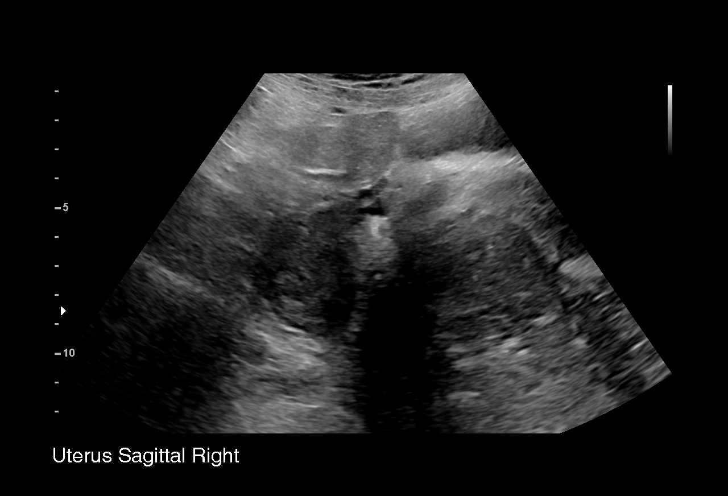
[im 7/47]
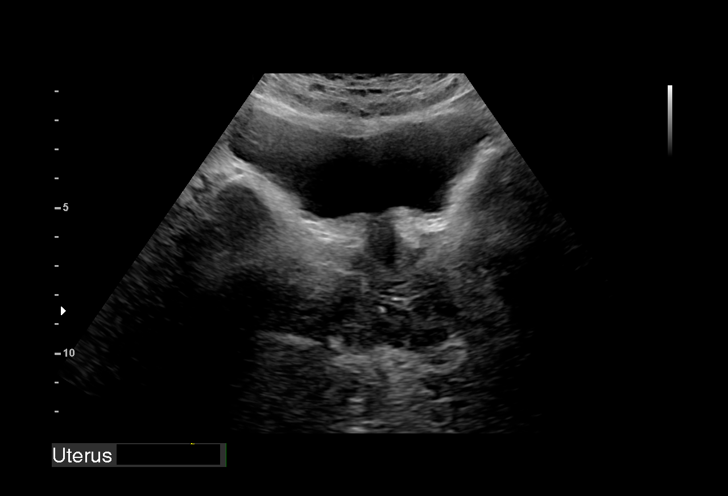
[im 11/47]
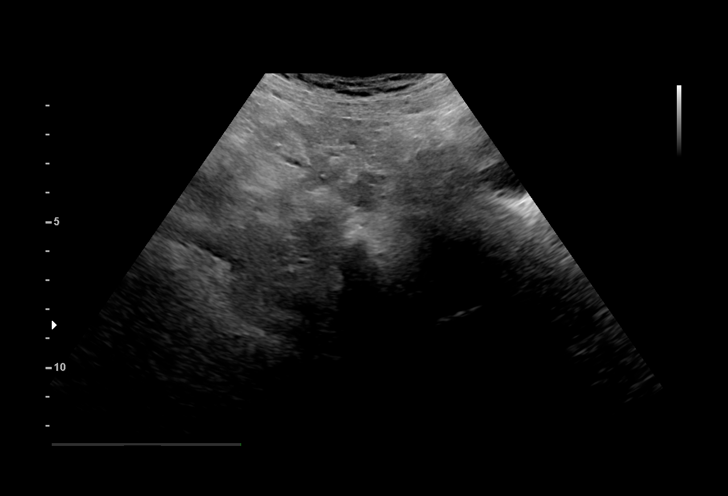
[im 14/47]
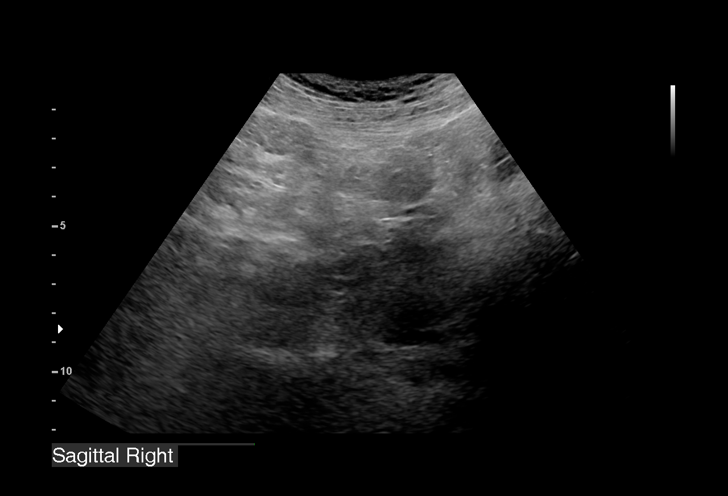
[im 18/47]
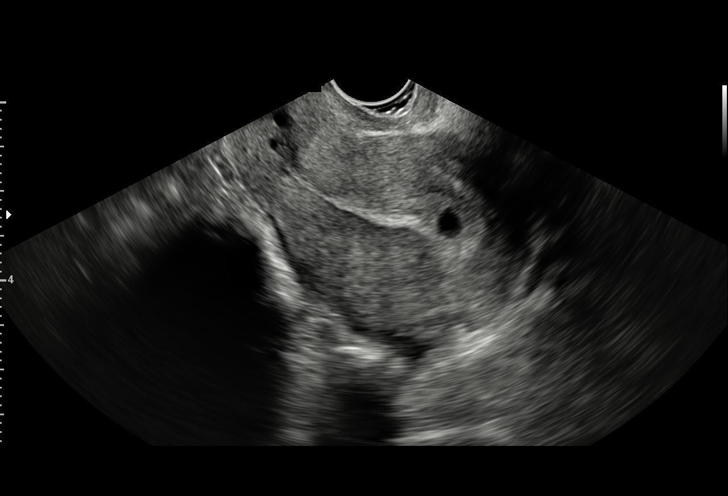
[im 21/47]
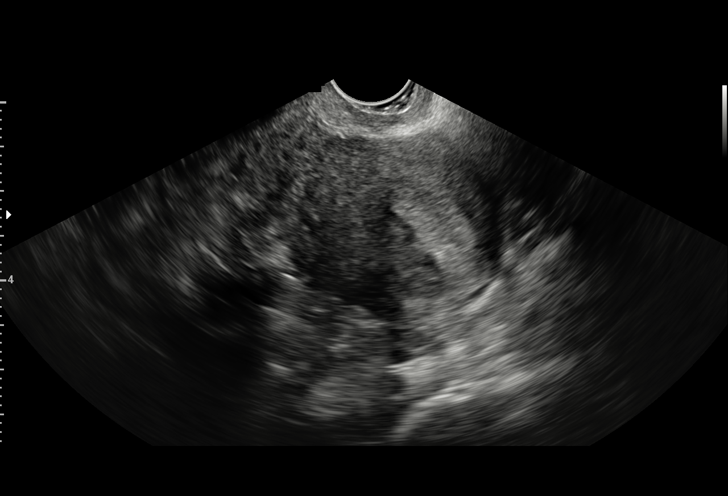
[im 24/47]
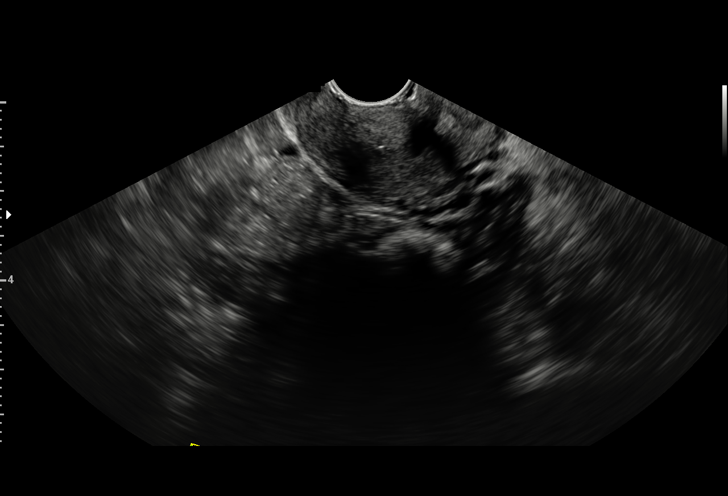
[im 26/47]
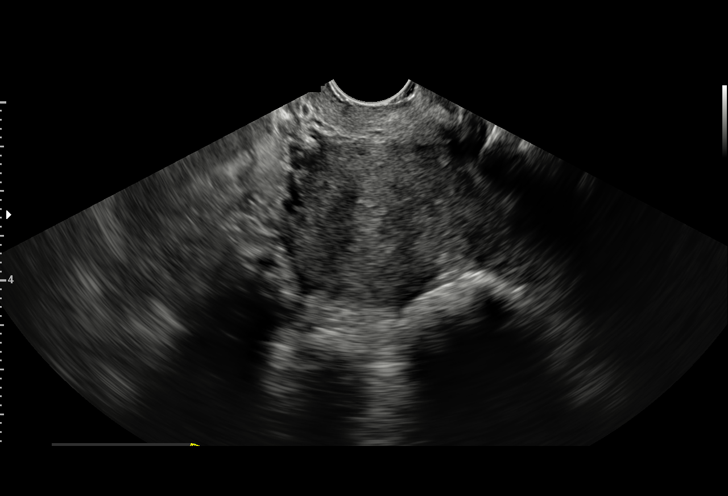
[im 29/47]
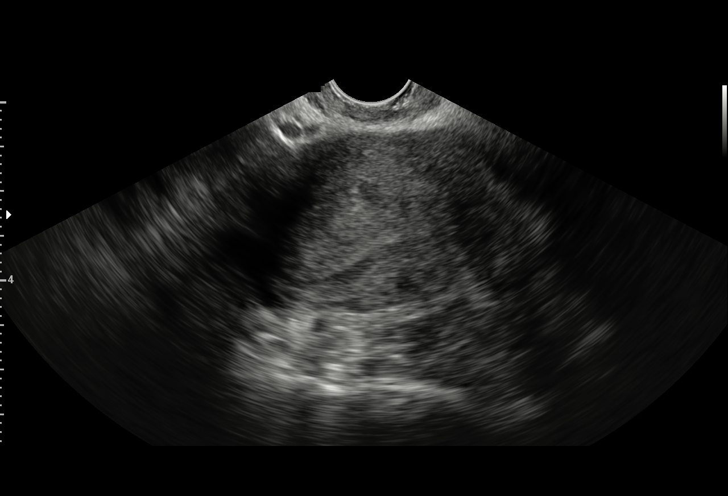
[im 33/47]
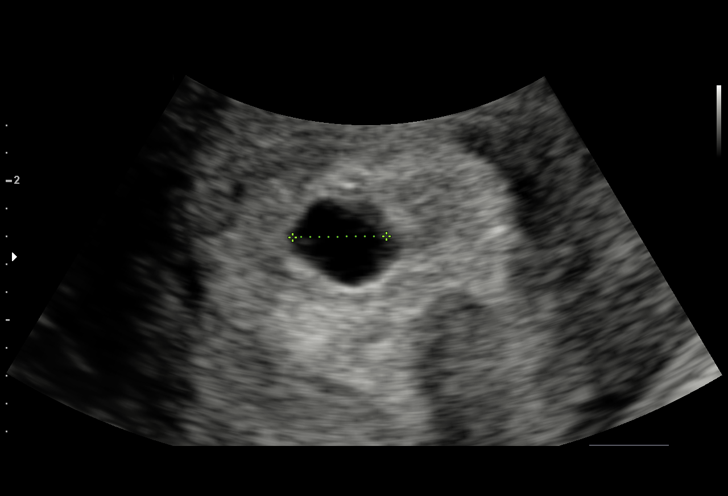
[im 36/47]
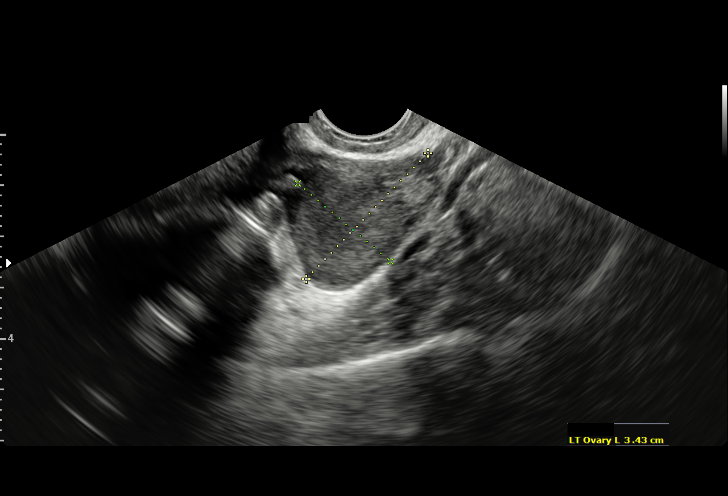
[im 40/47]
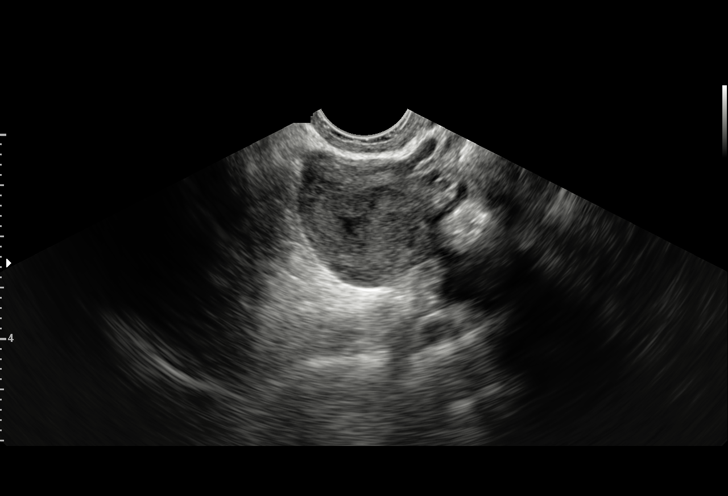
[im 43/47]
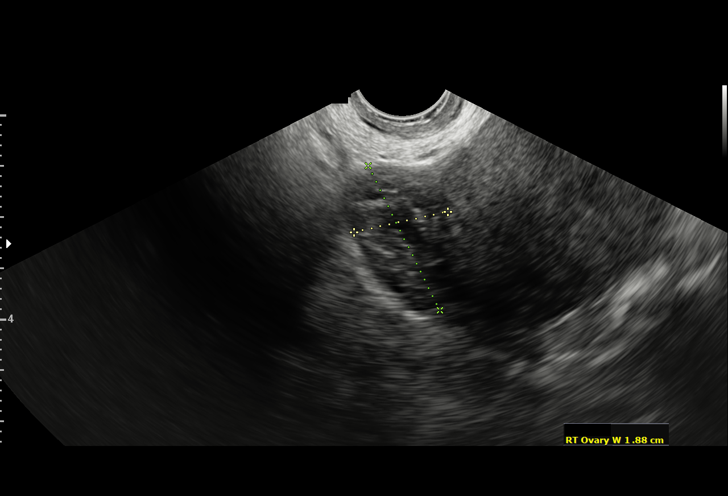
[im 47/47]
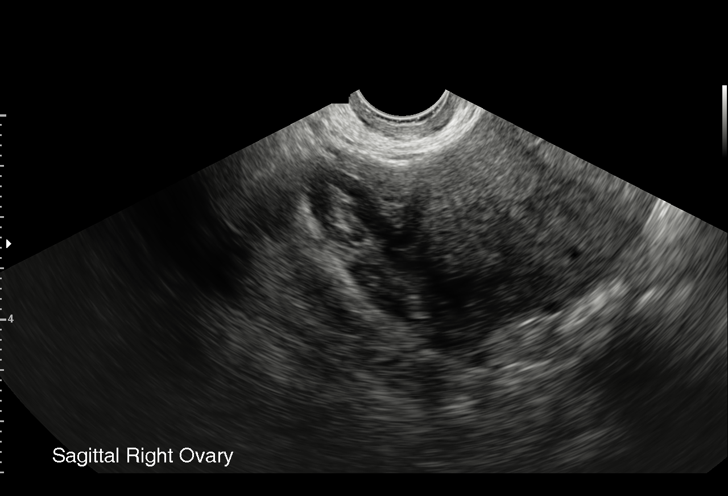

[15 of 28 positions shown; findings below may reference images not displayed]

FINDINGS: Intrauterine gestational sac: Single

Yolk sac:  Visualized.

Embryo:  Not Visualized.

Cardiac Activity: Not Visualized.

MSD: 8.5 mm   5 w   4 d

Subchorionic hemorrhage:  None visualized.

Maternal uterus/adnexae: The maternal uterus and adnexa are normal
in appearance. A probable corpus luteum cyst seen within the right
ovary.
IMPRESSION: Probable early intrauterine gestational sac and yolk sac, but no
fetal pole, or cardiac activity yet visualized. Recommend follow-up
quantitative B-HCG levels and follow-up US in 14 days to assess
viability. This recommendation follows SRU consensus guidelines:
Diagnostic Criteria for Nonviable Pregnancy Early in the First
Trimester. N Engl J Med 5439; [DATE].

## 2022-02-04 IMAGING — US US MFM OB DETAIL+14 WK
1 series · 13 of 28 positions shown · non-contrast
Comparison: none

[Series 1: us mfm ob detail+14 wk · 70 acquisitions, 13 frames shown]
[im 3/70]
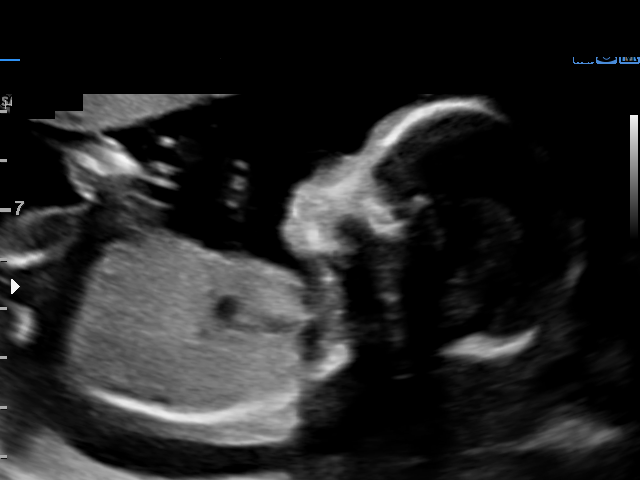
[im 8/70]
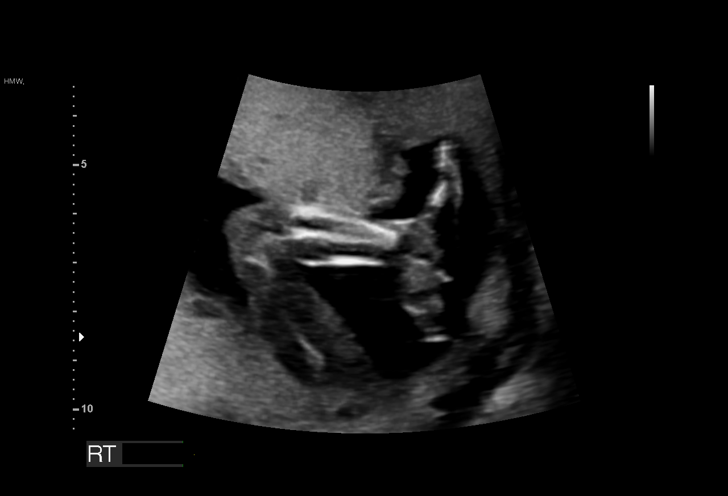
[im 13/70]
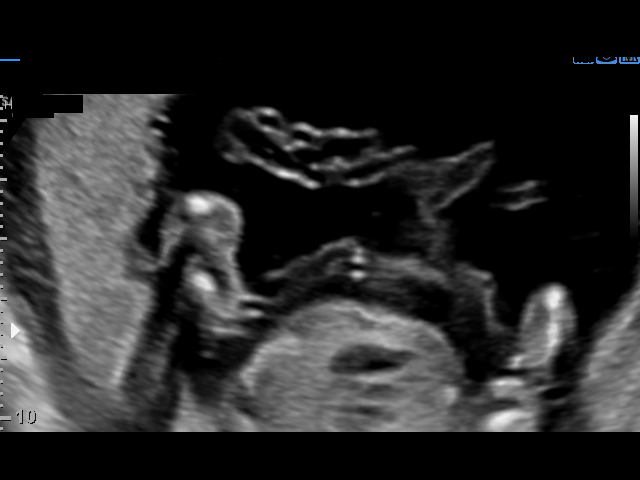
[im 18/70]
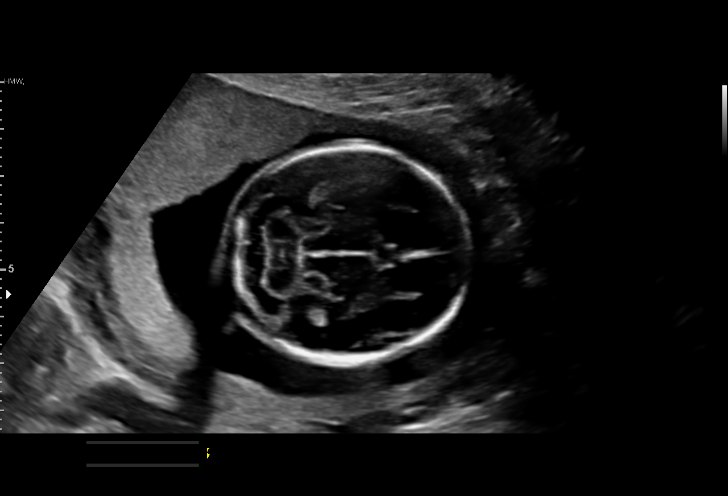
[im 24/70]
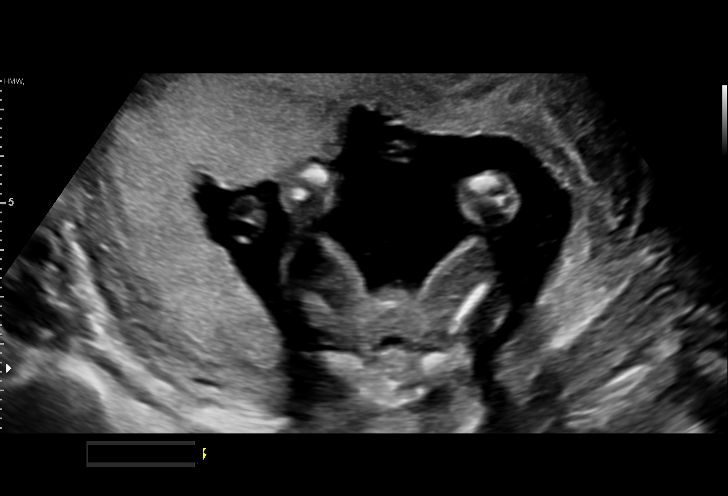
[im 29/70]
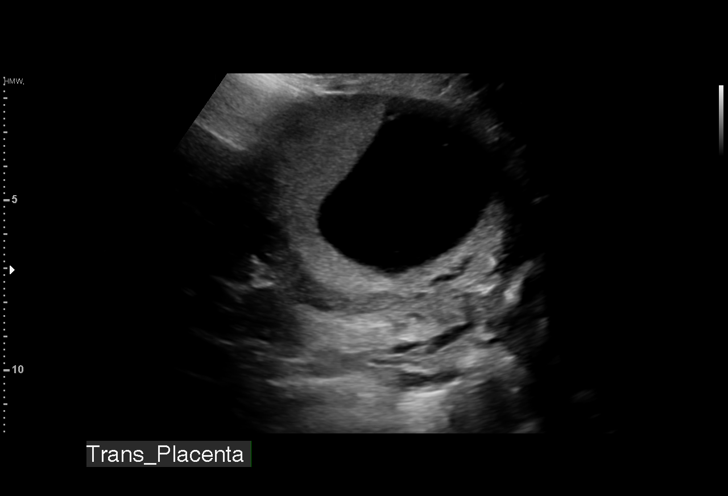
[im 36/70]
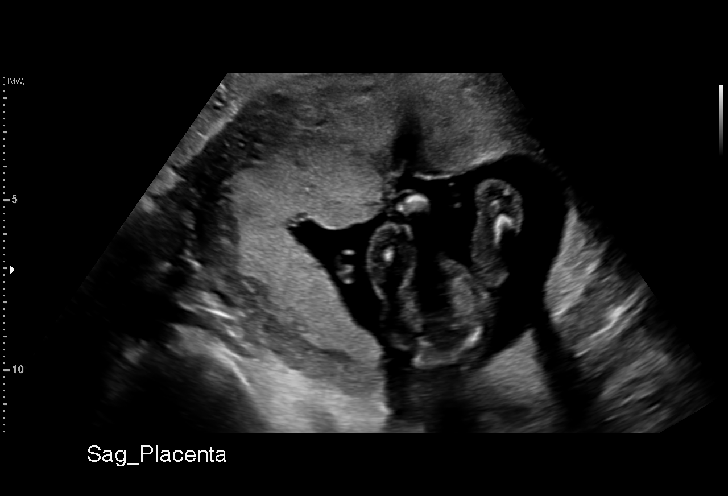
[im 41/70]
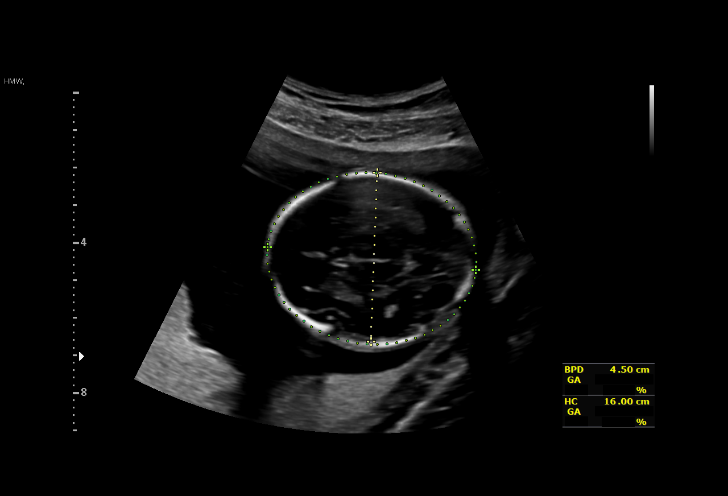
[im 47/70]
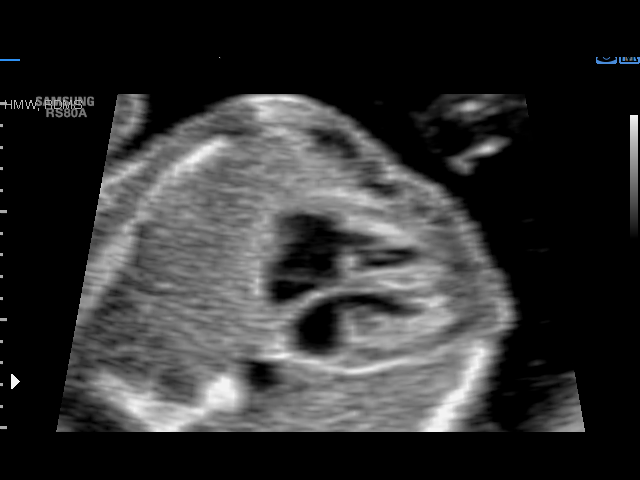
[im 52/70]
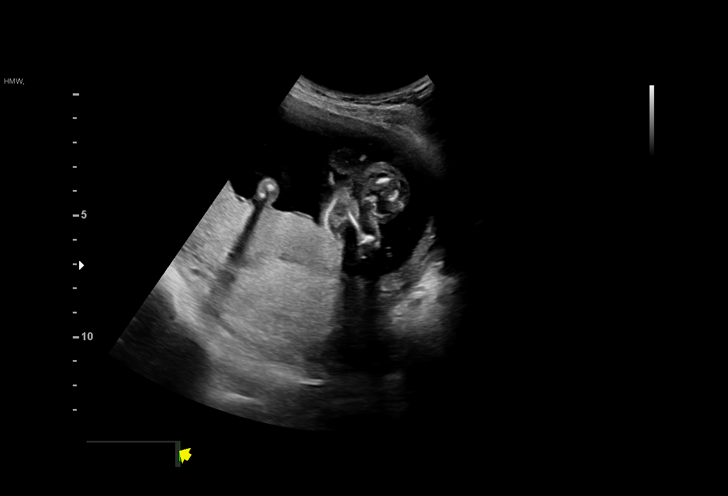
[im 57/70]
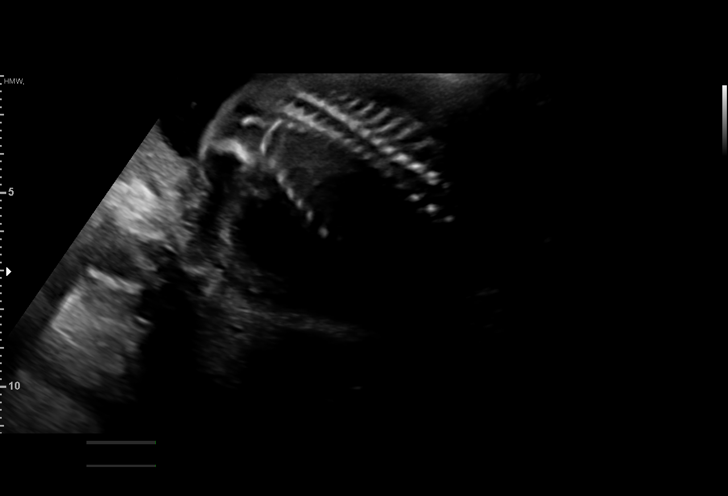
[im 62/70]
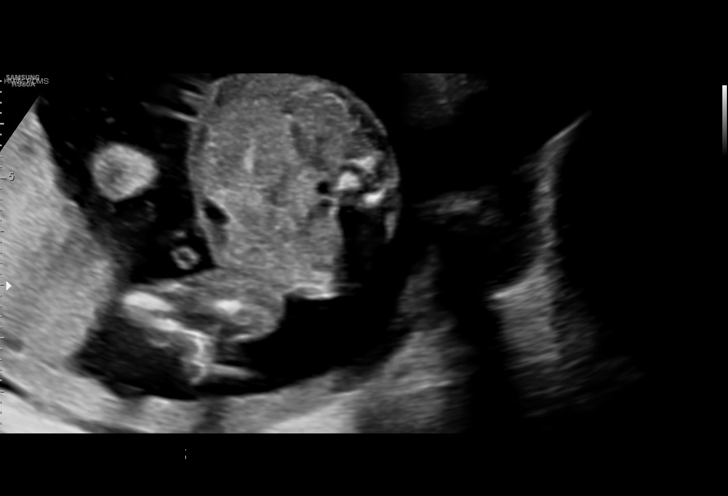
[im 67/70]
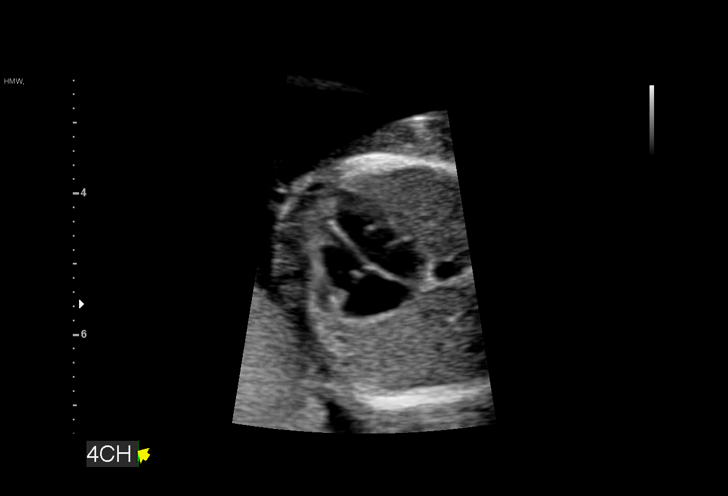

[13 of 28 positions shown; findings below may reference images not displayed]

[address]

 ----------------------------------------------------------------------

 ----------------------------------------------------------------------
Indications

  19 weeks gestation of pregnancy
  Encounter for antenatal screening for
  malformations
  Low risk NIPS, neg Horizon
  Advanced maternal age multigravida 35+,
  second trimester
  Drug dependence complicating pregnancy,
  antepartum condition or complication
  (methadone)
 ----------------------------------------------------------------------
Fetal Evaluation

 Num Of Fetuses:         1
 Fetal Heart Rate(bpm):  136
 Cardiac Activity:       Observed
 Presentation:           Breech
 Placenta:               Right lateral
 P. Cord Insertion:      Visualized, central

 Amniotic Fluid
 AFI FV:      Within normal limits

                             Largest Pocket(cm)

Biometry

 BPD:      44.6  mm     G. Age:  19w 3d         71  %    CI:        79.68   %    70 - 86
                                                         FL/HC:      17.4   %    16.1 -
 HC:      157.9  mm     G. Age:  18w 5d         27  %    HC/AC:      1.09        1.09 -
 AC:       145   mm     G. Age:  19w 6d         73  %    FL/BPD:     61.4   %
 FL:       27.4  mm     G. Age:  18w 3d         22  %    FL/AC:      18.9   %    20 - 24
 HUM:        28  mm     G. Age:  19w 0d         50  %
 CER:      18.7  mm     G. Age:  18w 2d         31  %
 NFT:       3.5  mm
 CM:        5.2  mm

 Est. FW:     274  gm    0 lb 10 oz      52  %
OB History

 Gravidity:    5         Term:   1        Prem:   0        SAB:   2
 TOP:          1       Ectopic:  0        Living: 1
Gestational Age

 LMP:           22w 2d        Date:  12/14/18                 EDD:   09/20/19
 U/S Today:     19w 1d                                        EDD:   10/12/19
 Best:          19w 0d     Det. By:  Early Ultrasound         EDD:   10/13/19
                                     (03/02/19)
Anatomy

 Cranium:               Appears normal         LVOT:                   Not well visualized
 Cavum:                 Appears normal         Aortic Arch:            Not well visualized
 Ventricles:            Not well visualized    Ductal Arch:            Not well visualized
 Choroid Plexus:        Appears normal         Diaphragm:              Not well visualized
 Cerebellum:            Appears normal         Stomach:                Appears normal, left
                                                                       sided
 Posterior Fossa:       Appears normal         Abdomen:                Appears normal
 Nuchal Fold:           Appears normal         Abdominal Wall:         Appears nml (cord
                                                                       insert, abd wall)
 Face:                  Appears normal         Cord Vessels:           Appears normal (3
                        (orbits and profile)                           vessel cord)
 Lips:                  Appears normal         Kidneys:                Appear normal
 Palate:                Not well visualized    Bladder:                Appears normal
 Thoracic:              Appears normal         Spine:                  Appears normal
 Heart:                 Echogenic focus        Upper Extremities:      Appears normal
                        in LV
 RVOT:                  Not well visualized    Lower Extremities:      Appears normal

 Other:  Heels and 5th digit visualized. Nasal bone visualized. Technically
         difficult due to fetal position.
Cervix Uterus Adnexa

 Cervix
 Length:            3.2  cm.
 Normal appearance by transabdominal scan.

 Uterus
 No abnormality visualized.

 Left Ovary
 No adnexal mass visualized.

 Right Ovary
 No adnexal mass visualized.

 Cul De Sac
 No free fluid seen.

 Adnexa
 No abnormality visualized.
Impression

 Ms. Jean Francois, Haden2 P1, is here for fetal anatomy scan.  On cell free
 fetal DNA screening, the risks of fetal aneuploidies are not
 increased.  Horizon carrier screening is negative.
 Patient reports no chronic medical conditions.  Obstetric
 history significant for a term vaginal delivery 10 years ago of
 a male infant.  Her son is in good health.
 We performed a fetal anatomy scan.  An echogenic focus is
 seen in the left ventricle.  No other markers of aneuploidies or
 fetal structural defects are seen.  Fetal biometry is consistent
 with the previously established dates.  Amniotic fluid is
 normal and good fetal activity seen.
 I explained the finding of echogenic focus that should be
 considered a normal variant given that she had low risk for
 Down syndrome on cell free fetal DNA screening.  I also
 reassured her that echogenic focus is not associated with any
 structural heart defects.
 I discussed the significance and limitations of cell free fetal
 DNA screening and informed her that only amniocentesis will
 give a definitive result on the fetal karyotype.  I explained
 amniocentesis procedure and its possible complication of
 miscarriage (1 and 500 procedures).
 Patient also met with our genetic counselor after ultrasound.
 She will discuss with her family and decide on amniocentesis.
Recommendations

 -An appointment was made for her to return in 4 weeks for
 completion of fetal anatomy.
                 Farooq, Dulce Maria
